# Patient Record
Sex: Female | Born: 1945
Health system: Southern US, Community
[De-identification: ages and names within clinical notes are randomized; demographics above are authoritative.]

## PROBLEM LIST (undated history)

## (undated) DIAGNOSIS — R011 Cardiac murmur, unspecified: Secondary | ICD-10-CM

## (undated) DIAGNOSIS — G8929 Other chronic pain: Secondary | ICD-10-CM

## (undated) DIAGNOSIS — M5416 Radiculopathy, lumbar region: Secondary | ICD-10-CM

## (undated) DIAGNOSIS — M1990 Unspecified osteoarthritis, unspecified site: Secondary | ICD-10-CM

## (undated) DIAGNOSIS — K219 Gastro-esophageal reflux disease without esophagitis: Secondary | ICD-10-CM

## (undated) DIAGNOSIS — G4730 Sleep apnea, unspecified: Secondary | ICD-10-CM

## (undated) DIAGNOSIS — I10 Essential (primary) hypertension: Secondary | ICD-10-CM

## (undated) DIAGNOSIS — R51 Headache: Secondary | ICD-10-CM

## (undated) HISTORY — PX: COLONOSCOPY: SHX5424

## (undated) HISTORY — PX: WISDOM TOOTH EXTRACTION: SHX21

---
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
## 1975-11-04 HISTORY — PX: ABDOMINAL HYSTERECTOMY: SHX81

## 1975-11-04 HISTORY — PX: APPENDECTOMY: SHX54

## 1988-07-30 HISTORY — PX: NASAL SINUS SURGERY: SHX719

## 1997-12-20 ENCOUNTER — Other Ambulatory Visit: Admission: RE | Admit: 1997-12-20 | Discharge: 1997-12-20 | Payer: Self-pay | Admitting: Gynecology

## 1999-02-24 ENCOUNTER — Other Ambulatory Visit: Admission: RE | Admit: 1999-02-24 | Discharge: 1999-02-24 | Payer: Self-pay | Admitting: Gynecology

## 1999-08-08 ENCOUNTER — Encounter: Admission: RE | Admit: 1999-08-08 | Discharge: 1999-08-08 | Payer: Self-pay | Admitting: *Deleted

## 1999-08-16 ENCOUNTER — Encounter: Payer: Self-pay | Admitting: Gynecology

## 1999-08-16 ENCOUNTER — Encounter: Admission: RE | Admit: 1999-08-16 | Discharge: 1999-08-16 | Payer: Self-pay | Admitting: Gynecology

## 1999-08-23 ENCOUNTER — Encounter: Admission: RE | Admit: 1999-08-23 | Discharge: 1999-08-23 | Payer: Self-pay | Admitting: Gynecology

## 1999-08-23 ENCOUNTER — Encounter: Payer: Self-pay | Admitting: Gynecology

## 1999-09-29 ENCOUNTER — Encounter: Admission: RE | Admit: 1999-09-29 | Discharge: 1999-09-29 | Payer: Self-pay | Admitting: *Deleted

## 2000-01-20 ENCOUNTER — Emergency Department (HOSPITAL_COMMUNITY): Admission: EM | Admit: 2000-01-20 | Discharge: 2000-01-20 | Payer: Self-pay | Admitting: Emergency Medicine

## 2000-02-05 ENCOUNTER — Encounter: Admission: RE | Admit: 2000-02-05 | Discharge: 2000-02-05 | Payer: Self-pay | Admitting: Endocrinology

## 2000-02-05 ENCOUNTER — Encounter: Payer: Self-pay | Admitting: Endocrinology

## 2000-03-04 ENCOUNTER — Encounter: Admission: RE | Admit: 2000-03-04 | Discharge: 2000-03-04 | Payer: Self-pay | Admitting: Gynecology

## 2000-03-04 ENCOUNTER — Encounter: Payer: Self-pay | Admitting: Gynecology

## 2000-05-17 ENCOUNTER — Other Ambulatory Visit: Admission: RE | Admit: 2000-05-17 | Discharge: 2000-05-17 | Payer: Self-pay | Admitting: Gynecology

## 2000-05-23 ENCOUNTER — Encounter: Admission: RE | Admit: 2000-05-23 | Discharge: 2000-05-23 | Payer: Self-pay | Admitting: Endocrinology

## 2000-05-23 ENCOUNTER — Encounter: Payer: Self-pay | Admitting: Internal Medicine

## 2000-09-04 ENCOUNTER — Encounter: Admission: RE | Admit: 2000-09-04 | Discharge: 2000-09-04 | Payer: Self-pay | Admitting: Gynecology

## 2000-09-04 ENCOUNTER — Encounter: Payer: Self-pay | Admitting: Gynecology

## 2001-06-17 ENCOUNTER — Encounter: Payer: Self-pay | Admitting: Internal Medicine

## 2001-06-17 ENCOUNTER — Encounter: Admission: RE | Admit: 2001-06-17 | Discharge: 2001-06-17 | Payer: Self-pay | Admitting: Internal Medicine

## 2001-07-28 ENCOUNTER — Other Ambulatory Visit: Admission: RE | Admit: 2001-07-28 | Discharge: 2001-07-28 | Payer: Self-pay | Admitting: Gynecology

## 2001-11-20 ENCOUNTER — Encounter: Payer: Self-pay | Admitting: Gynecology

## 2001-11-20 ENCOUNTER — Encounter: Admission: RE | Admit: 2001-11-20 | Discharge: 2001-11-20 | Payer: Self-pay | Admitting: Gynecology

## 2003-10-27 ENCOUNTER — Ambulatory Visit (HOSPITAL_COMMUNITY): Admission: RE | Admit: 2003-10-27 | Discharge: 2003-10-27 | Payer: Self-pay | Admitting: Gynecology

## 2005-06-07 ENCOUNTER — Other Ambulatory Visit: Admission: RE | Admit: 2005-06-07 | Discharge: 2005-06-07 | Payer: Self-pay | Admitting: Cardiology

## 2005-12-25 ENCOUNTER — Ambulatory Visit (HOSPITAL_COMMUNITY): Admission: RE | Admit: 2005-12-25 | Discharge: 2005-12-25 | Payer: Self-pay | Admitting: Endocrinology

## 2007-04-07 ENCOUNTER — Ambulatory Visit (HOSPITAL_COMMUNITY): Admission: RE | Admit: 2007-04-07 | Discharge: 2007-04-07 | Payer: Self-pay | Admitting: Endocrinology

## 2008-08-23 ENCOUNTER — Ambulatory Visit (HOSPITAL_COMMUNITY): Admission: RE | Admit: 2008-08-23 | Discharge: 2008-08-23 | Payer: Self-pay | Admitting: Endocrinology

## 2010-03-23 ENCOUNTER — Ambulatory Visit (HOSPITAL_COMMUNITY): Admission: RE | Admit: 2010-03-23 | Discharge: 2010-03-23 | Payer: Self-pay | Admitting: Internal Medicine

## 2010-08-01 ENCOUNTER — Encounter
Admission: RE | Admit: 2010-08-01 | Discharge: 2010-08-01 | Payer: Self-pay | Source: Home / Self Care | Attending: Internal Medicine | Admitting: Internal Medicine

## 2010-08-20 ENCOUNTER — Encounter: Payer: Self-pay | Admitting: Endocrinology

## 2010-08-20 ENCOUNTER — Encounter: Payer: Self-pay | Admitting: Internal Medicine

## 2011-08-09 DIAGNOSIS — J111 Influenza due to unidentified influenza virus with other respiratory manifestations: Secondary | ICD-10-CM | POA: Diagnosis not present

## 2011-08-15 DIAGNOSIS — G43009 Migraine without aura, not intractable, without status migrainosus: Secondary | ICD-10-CM | POA: Diagnosis not present

## 2011-08-15 DIAGNOSIS — M545 Low back pain: Secondary | ICD-10-CM | POA: Diagnosis not present

## 2011-08-15 DIAGNOSIS — M5126 Other intervertebral disc displacement, lumbar region: Secondary | ICD-10-CM | POA: Diagnosis not present

## 2011-08-15 DIAGNOSIS — M79609 Pain in unspecified limb: Secondary | ICD-10-CM | POA: Diagnosis not present

## 2011-08-28 DIAGNOSIS — M545 Low back pain: Secondary | ICD-10-CM | POA: Diagnosis not present

## 2011-08-28 DIAGNOSIS — M5430 Sciatica, unspecified side: Secondary | ICD-10-CM | POA: Diagnosis not present

## 2011-08-28 DIAGNOSIS — M999 Biomechanical lesion, unspecified: Secondary | ICD-10-CM | POA: Diagnosis not present

## 2011-08-28 DIAGNOSIS — M47817 Spondylosis without myelopathy or radiculopathy, lumbosacral region: Secondary | ICD-10-CM | POA: Diagnosis not present

## 2011-08-28 DIAGNOSIS — M5137 Other intervertebral disc degeneration, lumbosacral region: Secondary | ICD-10-CM | POA: Diagnosis not present

## 2011-08-28 DIAGNOSIS — M5126 Other intervertebral disc displacement, lumbar region: Secondary | ICD-10-CM | POA: Diagnosis not present

## 2011-08-31 DIAGNOSIS — M545 Low back pain: Secondary | ICD-10-CM | POA: Diagnosis not present

## 2011-08-31 DIAGNOSIS — M5137 Other intervertebral disc degeneration, lumbosacral region: Secondary | ICD-10-CM | POA: Diagnosis not present

## 2011-08-31 DIAGNOSIS — M999 Biomechanical lesion, unspecified: Secondary | ICD-10-CM | POA: Diagnosis not present

## 2011-08-31 DIAGNOSIS — M5126 Other intervertebral disc displacement, lumbar region: Secondary | ICD-10-CM | POA: Diagnosis not present

## 2011-08-31 DIAGNOSIS — M47817 Spondylosis without myelopathy or radiculopathy, lumbosacral region: Secondary | ICD-10-CM | POA: Diagnosis not present

## 2011-08-31 DIAGNOSIS — M5430 Sciatica, unspecified side: Secondary | ICD-10-CM | POA: Diagnosis not present

## 2011-09-03 DIAGNOSIS — M5126 Other intervertebral disc displacement, lumbar region: Secondary | ICD-10-CM | POA: Diagnosis not present

## 2011-09-03 DIAGNOSIS — M5430 Sciatica, unspecified side: Secondary | ICD-10-CM | POA: Diagnosis not present

## 2011-09-03 DIAGNOSIS — M47817 Spondylosis without myelopathy or radiculopathy, lumbosacral region: Secondary | ICD-10-CM | POA: Diagnosis not present

## 2011-09-03 DIAGNOSIS — M999 Biomechanical lesion, unspecified: Secondary | ICD-10-CM | POA: Diagnosis not present

## 2011-09-03 DIAGNOSIS — M545 Low back pain: Secondary | ICD-10-CM | POA: Diagnosis not present

## 2011-09-03 DIAGNOSIS — M5137 Other intervertebral disc degeneration, lumbosacral region: Secondary | ICD-10-CM | POA: Diagnosis not present

## 2011-09-05 DIAGNOSIS — M5126 Other intervertebral disc displacement, lumbar region: Secondary | ICD-10-CM | POA: Diagnosis not present

## 2011-09-05 DIAGNOSIS — G43009 Migraine without aura, not intractable, without status migrainosus: Secondary | ICD-10-CM | POA: Diagnosis not present

## 2011-09-05 DIAGNOSIS — M79609 Pain in unspecified limb: Secondary | ICD-10-CM | POA: Diagnosis not present

## 2011-09-05 DIAGNOSIS — M545 Low back pain: Secondary | ICD-10-CM | POA: Diagnosis not present

## 2011-09-07 DIAGNOSIS — M5126 Other intervertebral disc displacement, lumbar region: Secondary | ICD-10-CM | POA: Diagnosis not present

## 2011-09-07 DIAGNOSIS — M5137 Other intervertebral disc degeneration, lumbosacral region: Secondary | ICD-10-CM | POA: Diagnosis not present

## 2011-09-07 DIAGNOSIS — I10 Essential (primary) hypertension: Secondary | ICD-10-CM | POA: Diagnosis not present

## 2011-09-07 DIAGNOSIS — R0989 Other specified symptoms and signs involving the circulatory and respiratory systems: Secondary | ICD-10-CM | POA: Diagnosis not present

## 2011-09-07 DIAGNOSIS — M5430 Sciatica, unspecified side: Secondary | ICD-10-CM | POA: Diagnosis not present

## 2011-09-07 DIAGNOSIS — G4733 Obstructive sleep apnea (adult) (pediatric): Secondary | ICD-10-CM | POA: Diagnosis not present

## 2011-09-07 DIAGNOSIS — G4700 Insomnia, unspecified: Secondary | ICD-10-CM | POA: Diagnosis not present

## 2011-09-07 DIAGNOSIS — G4710 Hypersomnia, unspecified: Secondary | ICD-10-CM | POA: Diagnosis not present

## 2011-09-07 DIAGNOSIS — M999 Biomechanical lesion, unspecified: Secondary | ICD-10-CM | POA: Diagnosis not present

## 2011-09-07 DIAGNOSIS — M545 Low back pain: Secondary | ICD-10-CM | POA: Diagnosis not present

## 2011-09-07 DIAGNOSIS — R5382 Chronic fatigue, unspecified: Secondary | ICD-10-CM | POA: Diagnosis not present

## 2011-09-07 DIAGNOSIS — M47817 Spondylosis without myelopathy or radiculopathy, lumbosacral region: Secondary | ICD-10-CM | POA: Diagnosis not present

## 2011-09-10 DIAGNOSIS — M5430 Sciatica, unspecified side: Secondary | ICD-10-CM | POA: Diagnosis not present

## 2011-09-10 DIAGNOSIS — M5126 Other intervertebral disc displacement, lumbar region: Secondary | ICD-10-CM | POA: Diagnosis not present

## 2011-09-10 DIAGNOSIS — M47817 Spondylosis without myelopathy or radiculopathy, lumbosacral region: Secondary | ICD-10-CM | POA: Diagnosis not present

## 2011-09-10 DIAGNOSIS — M999 Biomechanical lesion, unspecified: Secondary | ICD-10-CM | POA: Diagnosis not present

## 2011-09-10 DIAGNOSIS — M5137 Other intervertebral disc degeneration, lumbosacral region: Secondary | ICD-10-CM | POA: Diagnosis not present

## 2011-09-10 DIAGNOSIS — M545 Low back pain: Secondary | ICD-10-CM | POA: Diagnosis not present

## 2011-09-16 DIAGNOSIS — G4733 Obstructive sleep apnea (adult) (pediatric): Secondary | ICD-10-CM | POA: Diagnosis not present

## 2011-09-17 DIAGNOSIS — M5137 Other intervertebral disc degeneration, lumbosacral region: Secondary | ICD-10-CM | POA: Diagnosis not present

## 2011-09-17 DIAGNOSIS — M5430 Sciatica, unspecified side: Secondary | ICD-10-CM | POA: Diagnosis not present

## 2011-09-17 DIAGNOSIS — M47817 Spondylosis without myelopathy or radiculopathy, lumbosacral region: Secondary | ICD-10-CM | POA: Diagnosis not present

## 2011-09-17 DIAGNOSIS — M999 Biomechanical lesion, unspecified: Secondary | ICD-10-CM | POA: Diagnosis not present

## 2011-09-17 DIAGNOSIS — M545 Low back pain: Secondary | ICD-10-CM | POA: Diagnosis not present

## 2011-09-17 DIAGNOSIS — M5126 Other intervertebral disc displacement, lumbar region: Secondary | ICD-10-CM | POA: Diagnosis not present

## 2011-10-08 DIAGNOSIS — M5126 Other intervertebral disc displacement, lumbar region: Secondary | ICD-10-CM | POA: Diagnosis not present

## 2011-10-15 DIAGNOSIS — M5126 Other intervertebral disc displacement, lumbar region: Secondary | ICD-10-CM | POA: Diagnosis not present

## 2011-10-18 DIAGNOSIS — M5126 Other intervertebral disc displacement, lumbar region: Secondary | ICD-10-CM | POA: Diagnosis not present

## 2011-10-24 DIAGNOSIS — M5126 Other intervertebral disc displacement, lumbar region: Secondary | ICD-10-CM | POA: Diagnosis not present

## 2011-10-31 DIAGNOSIS — M5137 Other intervertebral disc degeneration, lumbosacral region: Secondary | ICD-10-CM | POA: Diagnosis not present

## 2011-10-31 DIAGNOSIS — M545 Low back pain: Secondary | ICD-10-CM | POA: Diagnosis not present

## 2011-11-06 DIAGNOSIS — R937 Abnormal findings on diagnostic imaging of other parts of musculoskeletal system: Secondary | ICD-10-CM | POA: Diagnosis not present

## 2011-11-06 DIAGNOSIS — M545 Low back pain: Secondary | ICD-10-CM | POA: Diagnosis not present

## 2011-11-13 DIAGNOSIS — H43819 Vitreous degeneration, unspecified eye: Secondary | ICD-10-CM | POA: Diagnosis not present

## 2011-11-13 DIAGNOSIS — H401290 Low-tension glaucoma, unspecified eye, stage unspecified: Secondary | ICD-10-CM | POA: Diagnosis not present

## 2011-12-11 DIAGNOSIS — M79609 Pain in unspecified limb: Secondary | ICD-10-CM | POA: Diagnosis not present

## 2011-12-11 DIAGNOSIS — M5126 Other intervertebral disc displacement, lumbar region: Secondary | ICD-10-CM | POA: Diagnosis not present

## 2011-12-11 DIAGNOSIS — M545 Low back pain: Secondary | ICD-10-CM | POA: Diagnosis not present

## 2011-12-11 DIAGNOSIS — G43009 Migraine without aura, not intractable, without status migrainosus: Secondary | ICD-10-CM | POA: Diagnosis not present

## 2012-01-01 DIAGNOSIS — G4733 Obstructive sleep apnea (adult) (pediatric): Secondary | ICD-10-CM | POA: Diagnosis not present

## 2012-01-03 DIAGNOSIS — D485 Neoplasm of uncertain behavior of skin: Secondary | ICD-10-CM | POA: Diagnosis not present

## 2012-01-03 DIAGNOSIS — L570 Actinic keratosis: Secondary | ICD-10-CM | POA: Diagnosis not present

## 2012-01-03 DIAGNOSIS — L918 Other hypertrophic disorders of the skin: Secondary | ICD-10-CM | POA: Diagnosis not present

## 2012-01-30 DIAGNOSIS — R51 Headache: Secondary | ICD-10-CM | POA: Diagnosis not present

## 2012-02-07 DIAGNOSIS — E663 Overweight: Secondary | ICD-10-CM | POA: Diagnosis not present

## 2012-02-07 DIAGNOSIS — G43909 Migraine, unspecified, not intractable, without status migrainosus: Secondary | ICD-10-CM | POA: Diagnosis not present

## 2012-02-13 DIAGNOSIS — M79609 Pain in unspecified limb: Secondary | ICD-10-CM | POA: Diagnosis not present

## 2012-02-13 DIAGNOSIS — M5126 Other intervertebral disc displacement, lumbar region: Secondary | ICD-10-CM | POA: Diagnosis not present

## 2012-02-13 DIAGNOSIS — M545 Low back pain: Secondary | ICD-10-CM | POA: Diagnosis not present

## 2012-02-13 DIAGNOSIS — G43009 Migraine without aura, not intractable, without status migrainosus: Secondary | ICD-10-CM | POA: Diagnosis not present

## 2012-02-15 DIAGNOSIS — M545 Low back pain: Secondary | ICD-10-CM | POA: Diagnosis not present

## 2012-02-15 DIAGNOSIS — M5137 Other intervertebral disc degeneration, lumbosacral region: Secondary | ICD-10-CM | POA: Diagnosis not present

## 2012-02-15 DIAGNOSIS — R937 Abnormal findings on diagnostic imaging of other parts of musculoskeletal system: Secondary | ICD-10-CM | POA: Diagnosis not present

## 2012-02-15 DIAGNOSIS — M47817 Spondylosis without myelopathy or radiculopathy, lumbosacral region: Secondary | ICD-10-CM | POA: Diagnosis not present

## 2012-03-25 DIAGNOSIS — M79609 Pain in unspecified limb: Secondary | ICD-10-CM | POA: Diagnosis not present

## 2012-03-25 DIAGNOSIS — G43009 Migraine without aura, not intractable, without status migrainosus: Secondary | ICD-10-CM | POA: Diagnosis not present

## 2012-03-25 DIAGNOSIS — M545 Low back pain: Secondary | ICD-10-CM | POA: Diagnosis not present

## 2012-03-25 DIAGNOSIS — M5126 Other intervertebral disc displacement, lumbar region: Secondary | ICD-10-CM | POA: Diagnosis not present

## 2012-04-14 DIAGNOSIS — H43819 Vitreous degeneration, unspecified eye: Secondary | ICD-10-CM | POA: Diagnosis not present

## 2012-04-14 DIAGNOSIS — H401290 Low-tension glaucoma, unspecified eye, stage unspecified: Secondary | ICD-10-CM | POA: Diagnosis not present

## 2012-04-20 DIAGNOSIS — Z23 Encounter for immunization: Secondary | ICD-10-CM | POA: Diagnosis not present

## 2012-05-30 DIAGNOSIS — M79609 Pain in unspecified limb: Secondary | ICD-10-CM | POA: Diagnosis not present

## 2012-05-30 DIAGNOSIS — M545 Low back pain: Secondary | ICD-10-CM | POA: Diagnosis not present

## 2012-05-30 DIAGNOSIS — M542 Cervicalgia: Secondary | ICD-10-CM | POA: Diagnosis not present

## 2012-05-30 DIAGNOSIS — M5126 Other intervertebral disc displacement, lumbar region: Secondary | ICD-10-CM | POA: Diagnosis not present

## 2012-05-30 DIAGNOSIS — G2581 Restless legs syndrome: Secondary | ICD-10-CM | POA: Diagnosis not present

## 2012-05-30 DIAGNOSIS — G43009 Migraine without aura, not intractable, without status migrainosus: Secondary | ICD-10-CM | POA: Diagnosis not present

## 2012-09-18 DIAGNOSIS — E559 Vitamin D deficiency, unspecified: Secondary | ICD-10-CM | POA: Diagnosis not present

## 2012-09-18 DIAGNOSIS — I10 Essential (primary) hypertension: Secondary | ICD-10-CM | POA: Diagnosis not present

## 2012-09-18 DIAGNOSIS — E7800 Pure hypercholesterolemia, unspecified: Secondary | ICD-10-CM | POA: Diagnosis not present

## 2012-09-23 DIAGNOSIS — Z0000 Encounter for general adult medical examination without abnormal findings: Secondary | ICD-10-CM | POA: Diagnosis not present

## 2012-09-24 DIAGNOSIS — I10 Essential (primary) hypertension: Secondary | ICD-10-CM | POA: Diagnosis not present

## 2012-09-24 DIAGNOSIS — R252 Cramp and spasm: Secondary | ICD-10-CM | POA: Diagnosis not present

## 2012-09-24 DIAGNOSIS — M549 Dorsalgia, unspecified: Secondary | ICD-10-CM | POA: Diagnosis not present

## 2012-09-24 DIAGNOSIS — H409 Unspecified glaucoma: Secondary | ICD-10-CM | POA: Diagnosis not present

## 2012-10-01 DIAGNOSIS — G43009 Migraine without aura, not intractable, without status migrainosus: Secondary | ICD-10-CM | POA: Diagnosis not present

## 2012-10-01 DIAGNOSIS — M545 Low back pain: Secondary | ICD-10-CM | POA: Diagnosis not present

## 2012-10-01 DIAGNOSIS — G2581 Restless legs syndrome: Secondary | ICD-10-CM | POA: Diagnosis not present

## 2012-10-01 DIAGNOSIS — M542 Cervicalgia: Secondary | ICD-10-CM | POA: Diagnosis not present

## 2012-10-01 DIAGNOSIS — M5126 Other intervertebral disc displacement, lumbar region: Secondary | ICD-10-CM | POA: Diagnosis not present

## 2012-10-01 DIAGNOSIS — M79609 Pain in unspecified limb: Secondary | ICD-10-CM | POA: Diagnosis not present

## 2012-10-16 DIAGNOSIS — Z79899 Other long term (current) drug therapy: Secondary | ICD-10-CM | POA: Diagnosis not present

## 2012-10-16 DIAGNOSIS — M1990 Unspecified osteoarthritis, unspecified site: Secondary | ICD-10-CM | POA: Diagnosis not present

## 2012-10-16 DIAGNOSIS — G894 Chronic pain syndrome: Secondary | ICD-10-CM | POA: Diagnosis not present

## 2012-10-16 DIAGNOSIS — M5137 Other intervertebral disc degeneration, lumbosacral region: Secondary | ICD-10-CM | POA: Diagnosis not present

## 2012-10-23 DIAGNOSIS — H401290 Low-tension glaucoma, unspecified eye, stage unspecified: Secondary | ICD-10-CM | POA: Diagnosis not present

## 2012-10-23 DIAGNOSIS — H43819 Vitreous degeneration, unspecified eye: Secondary | ICD-10-CM | POA: Diagnosis not present

## 2012-11-13 DIAGNOSIS — Z79899 Other long term (current) drug therapy: Secondary | ICD-10-CM | POA: Diagnosis not present

## 2012-11-13 DIAGNOSIS — M79609 Pain in unspecified limb: Secondary | ICD-10-CM | POA: Diagnosis not present

## 2012-11-13 DIAGNOSIS — M5137 Other intervertebral disc degeneration, lumbosacral region: Secondary | ICD-10-CM | POA: Diagnosis not present

## 2012-11-13 DIAGNOSIS — G894 Chronic pain syndrome: Secondary | ICD-10-CM | POA: Diagnosis not present

## 2013-01-15 DIAGNOSIS — Z79899 Other long term (current) drug therapy: Secondary | ICD-10-CM | POA: Diagnosis not present

## 2013-01-15 DIAGNOSIS — G894 Chronic pain syndrome: Secondary | ICD-10-CM | POA: Diagnosis not present

## 2013-01-15 DIAGNOSIS — M5137 Other intervertebral disc degeneration, lumbosacral region: Secondary | ICD-10-CM | POA: Diagnosis not present

## 2013-01-15 DIAGNOSIS — M1990 Unspecified osteoarthritis, unspecified site: Secondary | ICD-10-CM | POA: Diagnosis not present

## 2013-02-12 DIAGNOSIS — IMO0001 Reserved for inherently not codable concepts without codable children: Secondary | ICD-10-CM | POA: Diagnosis not present

## 2013-02-12 DIAGNOSIS — M1990 Unspecified osteoarthritis, unspecified site: Secondary | ICD-10-CM | POA: Diagnosis not present

## 2013-02-12 DIAGNOSIS — M5137 Other intervertebral disc degeneration, lumbosacral region: Secondary | ICD-10-CM | POA: Diagnosis not present

## 2013-02-12 DIAGNOSIS — G894 Chronic pain syndrome: Secondary | ICD-10-CM | POA: Diagnosis not present

## 2013-02-24 DIAGNOSIS — E663 Overweight: Secondary | ICD-10-CM | POA: Diagnosis not present

## 2013-02-24 DIAGNOSIS — I10 Essential (primary) hypertension: Secondary | ICD-10-CM | POA: Diagnosis not present

## 2013-02-24 DIAGNOSIS — E7800 Pure hypercholesterolemia, unspecified: Secondary | ICD-10-CM | POA: Diagnosis not present

## 2013-02-24 DIAGNOSIS — M545 Low back pain: Secondary | ICD-10-CM | POA: Diagnosis not present

## 2013-02-24 DIAGNOSIS — F411 Generalized anxiety disorder: Secondary | ICD-10-CM | POA: Diagnosis not present

## 2013-02-24 DIAGNOSIS — Z006 Encounter for examination for normal comparison and control in clinical research program: Secondary | ICD-10-CM | POA: Diagnosis not present

## 2013-03-04 DIAGNOSIS — M810 Age-related osteoporosis without current pathological fracture: Secondary | ICD-10-CM | POA: Diagnosis not present

## 2013-03-05 ENCOUNTER — Telehealth: Payer: Self-pay | Admitting: *Deleted

## 2013-03-05 NOTE — Telephone Encounter (Signed)
Faxed supporting documentation  letter back to [facility] informing them that Dr. Tresa Endo was just the reading MD. He has never seen this patient before. They will need to get the documentation from the referring MD.

## 2013-04-02 DIAGNOSIS — G894 Chronic pain syndrome: Secondary | ICD-10-CM | POA: Diagnosis not present

## 2013-04-02 DIAGNOSIS — Z79899 Other long term (current) drug therapy: Secondary | ICD-10-CM | POA: Diagnosis not present

## 2013-04-02 DIAGNOSIS — M47817 Spondylosis without myelopathy or radiculopathy, lumbosacral region: Secondary | ICD-10-CM | POA: Diagnosis not present

## 2013-04-02 DIAGNOSIS — IMO0001 Reserved for inherently not codable concepts without codable children: Secondary | ICD-10-CM | POA: Diagnosis not present

## 2013-04-02 DIAGNOSIS — M1990 Unspecified osteoarthritis, unspecified site: Secondary | ICD-10-CM | POA: Diagnosis not present

## 2013-04-17 DIAGNOSIS — IMO0002 Reserved for concepts with insufficient information to code with codable children: Secondary | ICD-10-CM | POA: Diagnosis not present

## 2013-04-17 DIAGNOSIS — M546 Pain in thoracic spine: Secondary | ICD-10-CM | POA: Diagnosis not present

## 2013-04-17 DIAGNOSIS — M25579 Pain in unspecified ankle and joints of unspecified foot: Secondary | ICD-10-CM | POA: Diagnosis not present

## 2013-04-30 DIAGNOSIS — M47817 Spondylosis without myelopathy or radiculopathy, lumbosacral region: Secondary | ICD-10-CM | POA: Diagnosis not present

## 2013-04-30 DIAGNOSIS — IMO0001 Reserved for inherently not codable concepts without codable children: Secondary | ICD-10-CM | POA: Diagnosis not present

## 2013-04-30 DIAGNOSIS — Z79899 Other long term (current) drug therapy: Secondary | ICD-10-CM | POA: Diagnosis not present

## 2013-04-30 DIAGNOSIS — M1990 Unspecified osteoarthritis, unspecified site: Secondary | ICD-10-CM | POA: Diagnosis not present

## 2013-04-30 DIAGNOSIS — G894 Chronic pain syndrome: Secondary | ICD-10-CM | POA: Diagnosis not present

## 2013-05-06 DIAGNOSIS — M5137 Other intervertebral disc degeneration, lumbosacral region: Secondary | ICD-10-CM | POA: Diagnosis not present

## 2013-05-06 DIAGNOSIS — M542 Cervicalgia: Secondary | ICD-10-CM | POA: Diagnosis not present

## 2013-05-06 DIAGNOSIS — M48061 Spinal stenosis, lumbar region without neurogenic claudication: Secondary | ICD-10-CM | POA: Diagnosis not present

## 2013-05-06 DIAGNOSIS — M4310 Spondylolisthesis, site unspecified: Secondary | ICD-10-CM | POA: Diagnosis not present

## 2013-05-15 DIAGNOSIS — H2510 Age-related nuclear cataract, unspecified eye: Secondary | ICD-10-CM | POA: Diagnosis not present

## 2013-05-15 DIAGNOSIS — H401290 Low-tension glaucoma, unspecified eye, stage unspecified: Secondary | ICD-10-CM | POA: Diagnosis not present

## 2013-05-15 DIAGNOSIS — H43819 Vitreous degeneration, unspecified eye: Secondary | ICD-10-CM | POA: Diagnosis not present

## 2013-05-26 DIAGNOSIS — Z79899 Other long term (current) drug therapy: Secondary | ICD-10-CM | POA: Diagnosis not present

## 2013-05-26 DIAGNOSIS — G894 Chronic pain syndrome: Secondary | ICD-10-CM | POA: Diagnosis not present

## 2013-05-26 DIAGNOSIS — IMO0001 Reserved for inherently not codable concepts without codable children: Secondary | ICD-10-CM | POA: Diagnosis not present

## 2013-05-26 DIAGNOSIS — M5137 Other intervertebral disc degeneration, lumbosacral region: Secondary | ICD-10-CM | POA: Diagnosis not present

## 2013-05-26 DIAGNOSIS — M47817 Spondylosis without myelopathy or radiculopathy, lumbosacral region: Secondary | ICD-10-CM | POA: Diagnosis not present

## 2013-05-28 DIAGNOSIS — M25579 Pain in unspecified ankle and joints of unspecified foot: Secondary | ICD-10-CM | POA: Diagnosis not present

## 2013-05-28 DIAGNOSIS — M545 Low back pain: Secondary | ICD-10-CM | POA: Diagnosis not present

## 2013-05-28 DIAGNOSIS — M4310 Spondylolisthesis, site unspecified: Secondary | ICD-10-CM | POA: Diagnosis not present

## 2013-05-28 DIAGNOSIS — F341 Dysthymic disorder: Secondary | ICD-10-CM | POA: Diagnosis not present

## 2013-05-28 DIAGNOSIS — M48061 Spinal stenosis, lumbar region without neurogenic claudication: Secondary | ICD-10-CM | POA: Diagnosis not present

## 2013-06-02 ENCOUNTER — Emergency Department (HOSPITAL_COMMUNITY): Payer: No Typology Code available for payment source

## 2013-06-02 ENCOUNTER — Encounter (HOSPITAL_COMMUNITY): Payer: Self-pay | Admitting: Emergency Medicine

## 2013-06-02 ENCOUNTER — Inpatient Hospital Stay (HOSPITAL_COMMUNITY)
Admission: EM | Admit: 2013-06-02 | Discharge: 2013-06-03 | DRG: 087 | Disposition: A | Discharge status: Home / Self Care | Payer: No Typology Code available for payment source | Attending: Internal Medicine | Admitting: Internal Medicine

## 2013-06-02 DIAGNOSIS — S022XXA Fracture of nasal bones, initial encounter for closed fracture: Secondary | ICD-10-CM | POA: Diagnosis not present

## 2013-06-02 DIAGNOSIS — M2550 Pain in unspecified joint: Secondary | ICD-10-CM

## 2013-06-02 DIAGNOSIS — R220 Localized swelling, mass and lump, head: Secondary | ICD-10-CM | POA: Diagnosis not present

## 2013-06-02 DIAGNOSIS — M129 Arthropathy, unspecified: Secondary | ICD-10-CM | POA: Diagnosis present

## 2013-06-02 DIAGNOSIS — Z5189 Encounter for other specified aftercare: Secondary | ICD-10-CM | POA: Diagnosis not present

## 2013-06-02 DIAGNOSIS — S0993XA Unspecified injury of face, initial encounter: Secondary | ICD-10-CM | POA: Diagnosis not present

## 2013-06-02 DIAGNOSIS — S065XAA Traumatic subdural hemorrhage with loss of consciousness status unknown, initial encounter: Secondary | ICD-10-CM | POA: Diagnosis not present

## 2013-06-02 DIAGNOSIS — R55 Syncope and collapse: Secondary | ICD-10-CM | POA: Diagnosis present

## 2013-06-02 DIAGNOSIS — M79609 Pain in unspecified limb: Secondary | ICD-10-CM | POA: Diagnosis present

## 2013-06-02 DIAGNOSIS — S065X9A Traumatic subdural hemorrhage with loss of consciousness of unspecified duration, initial encounter: Secondary | ICD-10-CM | POA: Diagnosis present

## 2013-06-02 DIAGNOSIS — I6200 Nontraumatic subdural hemorrhage, unspecified: Secondary | ICD-10-CM

## 2013-06-02 DIAGNOSIS — S0990XA Unspecified injury of head, initial encounter: Secondary | ICD-10-CM | POA: Diagnosis not present

## 2013-06-02 DIAGNOSIS — S0992XA Unspecified injury of nose, initial encounter: Secondary | ICD-10-CM

## 2013-06-02 DIAGNOSIS — S298XXA Other specified injuries of thorax, initial encounter: Secondary | ICD-10-CM | POA: Diagnosis not present

## 2013-06-02 DIAGNOSIS — M47812 Spondylosis without myelopathy or radiculopathy, cervical region: Secondary | ICD-10-CM | POA: Diagnosis present

## 2013-06-02 DIAGNOSIS — R413 Other amnesia: Secondary | ICD-10-CM | POA: Diagnosis present

## 2013-06-02 DIAGNOSIS — I369 Nonrheumatic tricuspid valve disorder, unspecified: Secondary | ICD-10-CM | POA: Diagnosis not present

## 2013-06-02 DIAGNOSIS — T148XXA Other injury of unspecified body region, initial encounter: Secondary | ICD-10-CM | POA: Diagnosis not present

## 2013-06-02 DIAGNOSIS — S0992XD Unspecified injury of nose, subsequent encounter: Secondary | ICD-10-CM

## 2013-06-02 DIAGNOSIS — R404 Transient alteration of awareness: Secondary | ICD-10-CM | POA: Diagnosis not present

## 2013-06-02 HISTORY — DX: Unspecified osteoarthritis, unspecified site: M19.90

## 2013-06-02 HISTORY — DX: Headache: R51

## 2013-06-02 HISTORY — DX: Radiculopathy, lumbar region: M54.16

## 2013-06-02 HISTORY — DX: Other chronic pain: G89.29

## 2013-06-02 LAB — URINALYSIS, ROUTINE W REFLEX MICROSCOPIC
Glucose, UA: NEGATIVE mg/dL
Hgb urine dipstick: NEGATIVE
Protein, ur: NEGATIVE mg/dL
Specific Gravity, Urine: 1.009 (ref 1.005–1.030)

## 2013-06-02 LAB — CBC
Hemoglobin: 11.6 g/dL — ABNORMAL LOW (ref 12.0–15.0)
MCH: 28.9 pg (ref 26.0–34.0)
RBC: 4.01 MIL/uL (ref 3.87–5.11)

## 2013-06-02 LAB — BASIC METABOLIC PANEL
CO2: 28 mEq/L (ref 19–32)
Calcium: 9.3 mg/dL (ref 8.4–10.5)
Glucose, Bld: 111 mg/dL — ABNORMAL HIGH (ref 70–99)
Sodium: 139 mEq/L (ref 135–145)

## 2013-06-02 LAB — TROPONIN I: Troponin I: <0.3 ng/mL (ref <0.30)

## 2013-06-02 MED ORDER — ONDANSETRON HCL 4 MG PO TABS: 4.0000 mg | ORAL_TABLET | Freq: Four times a day (QID) | ORAL | Status: DC | PRN

## 2013-06-02 MED ORDER — OXYCODONE-ACETAMINOPHEN 5-325 MG PO TABS
1.0000 | ORAL_TABLET | Freq: Four times a day (QID) | ORAL | Status: DC | PRN
  Administered 2013-06-03: 1 via ORAL
  Filled 2013-06-02 (×2): qty 1

## 2013-06-02 MED ORDER — MORPHINE SULFATE 4 MG/ML IJ SOLN
4.0000 mg | Freq: Once | INTRAMUSCULAR | Status: AC
  Administered 2013-06-02: 4 mg via INTRAVENOUS
  Filled 2013-06-02: qty 1

## 2013-06-02 MED ORDER — ONDANSETRON HCL 4 MG/2ML IJ SOLN: 4.0000 mg | Freq: Four times a day (QID) | INTRAMUSCULAR | Status: DC | PRN

## 2013-06-02 MED ORDER — OXYCODONE-ACETAMINOPHEN 10-650 MG PO TABS: 1.0000 | ORAL_TABLET | Freq: Four times a day (QID) | ORAL | Status: DC | PRN

## 2013-06-02 MED ORDER — SODIUM CHLORIDE 0.9 % IJ SOLN: 3.0000 mL | Freq: Two times a day (BID) | INTRAMUSCULAR | Status: DC

## 2013-06-02 MED ORDER — FERROUS SULFATE 325 (65 FE) MG PO TABS
325.0000 mg | ORAL_TABLET | Freq: Two times a day (BID) | ORAL | Status: DC
  Administered 2013-06-03: 325 mg via ORAL
  Filled 2013-06-02 (×3): qty 1

## 2013-06-02 MED ORDER — BUPROPION HCL 75 MG PO TABS
75.0000 mg | ORAL_TABLET | Freq: Every day | ORAL | Status: DC
  Administered 2013-06-02 – 2013-06-03 (×2): 75 mg via ORAL
  Filled 2013-06-02 (×2): qty 1

## 2013-06-02 MED ORDER — DEXTROSE 5 % IV SOLN
1.0000 g | Freq: Once | INTRAVENOUS | Status: AC
  Administered 2013-06-02: 1 g via INTRAVENOUS
  Filled 2013-06-02: qty 10

## 2013-06-02 MED ORDER — SERTRALINE HCL 100 MG PO TABS
100.0000 mg | ORAL_TABLET | Freq: Every day | ORAL | Status: DC
  Administered 2013-06-02: 100 mg via ORAL
  Filled 2013-06-02 (×2): qty 1

## 2013-06-02 MED ORDER — OXYCODONE HCL 5 MG PO TABS: 5.0000 mg | ORAL_TABLET | Freq: Four times a day (QID) | ORAL | Status: DC | PRN

## 2013-06-02 MED ORDER — OXYCODONE HCL 5 MG PO TABS
5.0000 mg | ORAL_TABLET | ORAL | Status: DC | PRN
  Administered 2013-06-03 (×2): 5 mg via ORAL
  Filled 2013-06-02: qty 1

## 2013-06-02 MED ORDER — ETODOLAC 400 MG PO TABS
400.0000 mg | ORAL_TABLET | Freq: Two times a day (BID) | ORAL | Status: DC
  Administered 2013-06-02 – 2013-06-03 (×2): 400 mg via ORAL
  Filled 2013-06-02 (×3): qty 1
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
## 2013-06-02 NOTE — ED Notes (Signed)
Tray order placed for heart healthy diet.
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
## 2013-06-02 NOTE — ED Notes (Signed)
Patient at CT

## 2013-06-03 ENCOUNTER — Inpatient Hospital Stay (HOSPITAL_COMMUNITY): Payer: No Typology Code available for payment source

## 2013-06-03 DIAGNOSIS — M2550 Pain in unspecified joint: Secondary | ICD-10-CM | POA: Diagnosis present

## 2013-06-03 DIAGNOSIS — I369 Nonrheumatic tricuspid valve disorder, unspecified: Secondary | ICD-10-CM

## 2013-06-03 DIAGNOSIS — S022XXA Fracture of nasal bones, initial encounter for closed fracture: Secondary | ICD-10-CM

## 2013-06-03 DIAGNOSIS — R55 Syncope and collapse: Secondary | ICD-10-CM | POA: Diagnosis present

## 2013-06-03 DIAGNOSIS — R404 Transient alteration of awareness: Secondary | ICD-10-CM

## 2013-06-03 DIAGNOSIS — S0992XA Unspecified injury of nose, initial encounter: Secondary | ICD-10-CM

## 2013-06-03 DIAGNOSIS — S065X9A Traumatic subdural hemorrhage with loss of consciousness of unspecified duration, initial encounter: Secondary | ICD-10-CM | POA: Diagnosis present

## 2013-06-03 DIAGNOSIS — Z5189 Encounter for other specified aftercare: Secondary | ICD-10-CM

## 2013-06-03 LAB — COMPREHENSIVE METABOLIC PANEL
AST: 49 U/L — ABNORMAL HIGH (ref 0–37)
Albumin: 3.4 g/dL — ABNORMAL LOW (ref 3.5–5.2)
BUN: 25 mg/dL — ABNORMAL HIGH (ref 6–23)
Calcium: 9.8 mg/dL (ref 8.4–10.5)
Chloride: 108 mEq/L (ref 96–112)
Creatinine, Ser: 1.08 mg/dL (ref 0.50–1.10)
Glucose, Bld: 90 mg/dL (ref 70–99)
Total Bilirubin: 0.3 mg/dL (ref 0.3–1.2)
Total Protein: 6.2 g/dL (ref 6.0–8.3)

## 2013-06-03 LAB — CBC
HCT: 34.8 % — ABNORMAL LOW (ref 36.0–46.0)
Hemoglobin: 11.2 g/dL — ABNORMAL LOW (ref 12.0–15.0)
MCV: 88.5 fL (ref 78.0–100.0)
Platelets: 254 10*3/uL (ref 150–400)
RBC: 3.93 MIL/uL (ref 3.87–5.11)
WBC: 6.6 10*3/uL (ref 4.0–10.5)

## 2013-06-03 LAB — GLUCOSE, CAPILLARY: Glucose-Capillary: 124 mg/dL — ABNORMAL HIGH (ref 70–99)

## 2013-06-03 LAB — TSH: TSH: 1.114 u[IU]/mL (ref 0.350–4.500)

## 2013-06-03 NOTE — Progress Notes (Signed)
VASCULAR LAB PRELIMINARY  PRELIMINARY  PRELIMINARY  PRELIMINARY  Carotid duplex completed.    Preliminary report:  Bilateral : No evidence of ICA stenosis. Vertebral artery flow is antegrade.  Elsworth Ledin, RVS 06/03/2013, 3:00 PM

## 2013-06-03 NOTE — Progress Notes (Signed)
Echocardiogram 2D Echocardiogram has been performed.  Dorothey Baseman 06/03/2013, 11:47 AM
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
## 2013-06-03 NOTE — Progress Notes (Signed)
Discharge instructions reviewed with patient/family.  Follow up appointments reviewed.  Home medications reviewed as well as activity restrictions.  Both voice understanding to teaching. Copies of D/C instructions given to patient.  All Belongings returned to patient. To door via wheelchair. Home via POV with her daughter driving.
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
## 2013-06-03 NOTE — Progress Notes (Signed)
Patient is awake and alert.  Doing great.  Has not had repeat CT yet.  Daughter at bedside.    Will likely be able to go home later today or tomorrow.  Will get OMF followup.  This patient has been seen and I agree with the findings and treatment plan.  Marta Lamas. Gae Bon, MD, FACS [phone] (pager) [phone] (direct pager) Trauma Surgeon
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
## 2013-06-03 NOTE — Procedures (Signed)
EEG report.  Brief clinical history: 67 years old female who sustained an isolated episode of brief alteration of consciousness. Differential diagnosis is syncope versus seizures.  Technique: this is a 17 channel routine scalp EEG performed at the bedside with bipolar and monopolar montages arranged in accordance to the international 10/20 system of electrode placement. One channel was dedicated to EKG recording.  No sleep was achieved. No activating procedures performed.  Description:In the wakeful state, the best background consisted of a medium amplitude, posterior dominant, well sustained, symmetric and reactive 10 Hz rhythm. Frequent bursts of high amplitude, generalized theta slowing lasting up to 4-5 seconds and without a clear ictal pattern were seen. No focal or generalized epileptiform discharges noted.   EKG showed sinus rhythm.  Impression: this is an abnormal awake EEG with findings that could suggest global but non specific cerebral dysfunction. Please, be aware that the absence of epileptiform discharges does not exclude the possibility of epilepsy. If clinically indicated, a follow up EEG with sleep deprivation or an ambulatory 24 hour ambulatory EEG could provide further information regarding patient paroxysmal event.  Clinical correlation is advised.    Wyatt Portela, MD [facility]

## 2013-06-03 NOTE — Progress Notes (Signed)
EEG Completed; Results Pending  

## 2013-06-08 DIAGNOSIS — S022XXA Fracture of nasal bones, initial encounter for closed fracture: Secondary | ICD-10-CM | POA: Diagnosis not present

## 2013-06-08 DIAGNOSIS — S0180XA Unspecified open wound of other part of head, initial encounter: Secondary | ICD-10-CM | POA: Diagnosis not present

## 2013-06-09 ENCOUNTER — Encounter: Payer: Self-pay | Admitting: Neurology

## 2013-06-09 ENCOUNTER — Ambulatory Visit (INDEPENDENT_AMBULATORY_CARE_PROVIDER_SITE_OTHER): Payer: Medicare Other | Admitting: Neurology

## 2013-06-09 VITALS — BP 136/82 | HR 80 | Temp 98.0°F | Resp 14 | Ht 64.0 in | Wt 143.7 lb

## 2013-06-09 DIAGNOSIS — R55 Syncope and collapse: Secondary | ICD-10-CM | POA: Diagnosis not present
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
## 2013-06-09 NOTE — Patient Instructions (Addendum)
1.  No driving, no cooking alone, no working with heavy/dangerous equipment, no tub baths alone (door unlocked).  You may stay alone during the day. 2.  Have a great thanksgiving! 3.  MRI is scheduled for Thursday November 13th at 7:45am [facility] entrance A. [phone] 4.  EEG is scheduled for Tuesday November 25th at 9:00am [facility] entrance A. [phone]

## 2013-06-11 ENCOUNTER — Ambulatory Visit (HOSPITAL_COMMUNITY)
Admission: RE | Admit: 2013-06-11 | Discharge: 2013-06-11 | Disposition: A | Discharge status: Home / Self Care | Payer: Medicare Other | Source: Ambulatory Visit | Attending: Neurology | Admitting: Neurology

## 2013-06-11 DIAGNOSIS — R55 Syncope and collapse: Secondary | ICD-10-CM | POA: Insufficient documentation

## 2013-06-11 DIAGNOSIS — I6789 Other cerebrovascular disease: Secondary | ICD-10-CM | POA: Diagnosis not present

## 2013-06-11 DIAGNOSIS — S065X9A Traumatic subdural hemorrhage with loss of consciousness of unspecified duration, initial encounter: Secondary | ICD-10-CM | POA: Diagnosis not present

## 2013-06-11 DIAGNOSIS — S065XAA Traumatic subdural hemorrhage with loss of consciousness status unknown, initial encounter: Secondary | ICD-10-CM | POA: Insufficient documentation

## 2013-06-11 MED ORDER — GADOBENATE DIMEGLUMINE 529 MG/ML IV SOLN
15.0000 mL | Freq: Once | INTRAVENOUS | Status: AC | PRN
  Administered 2013-06-11: 14 mL via INTRAVENOUS

## 2013-06-23 ENCOUNTER — Ambulatory Visit (HOSPITAL_COMMUNITY)
Admission: RE | Admit: 2013-06-23 | Discharge: 2013-06-23 | Disposition: A | Discharge status: Home / Self Care | Payer: Medicare Other | Source: Ambulatory Visit | Attending: Neurology | Admitting: Neurology

## 2013-06-23 DIAGNOSIS — R55 Syncope and collapse: Secondary | ICD-10-CM

## 2013-06-23 DIAGNOSIS — R9401 Abnormal electroencephalogram [EEG]: Secondary | ICD-10-CM | POA: Insufficient documentation

## 2013-06-23 NOTE — Progress Notes (Signed)
24 hr Amb EEG started

## 2013-06-29 NOTE — Procedures (Signed)
TECHNICAL SUMMARY:  An 18 channel referential and bipolar montage ambulatory EEG was performed.  Total recording time was 24 hours, 13 minutes, 16 seconds.    The dominant background activity consists of 9-10 hertz activity seen most prominantly over the anterior head region.  Intermittent but frequent 5-7 hertz theta is noted in all head regions.  Subclinical rhythmical electrographic discharges of adults (SREDA) is noted.  This is a normal variant.  EPILEPTIFORM ACTIVITY:  There were no spikes, sharp waves or paroxysmal activity.  As above, subclinical rhythmical electrographic discharges of adults (SREDA) is noted.  This is a normal, benign variant.  CARDIAC:  The EKG lead revealed a regular sinus rhythm.  IMPRESSION:  This is a mildly abnormal EEG demonstrating mild diffuse slowing of electrocerebral activity.  This can be seen in a wide variety of encephalopathic states, including those of a toxic, metabolic, or degenerative nature. There is no focal, hemispheric, or lateralizing features.  No epileptiform activity was noted.  SREDA was present.

## 2013-07-03 ENCOUNTER — Telehealth: Payer: Self-pay | Admitting: Neurology

## 2013-07-03 NOTE — Telephone Encounter (Signed)
From my standpoint, don't anticipate expensive further tests.  Current tests look ok, we will discuss in more detail in follow up

## 2013-07-03 NOTE — Telephone Encounter (Signed)
Called pt and relayed your message. 

## 2013-07-03 NOTE — Telephone Encounter (Signed)
Pt is considering a change of insurance but wants to see if her test results are avail? If so, based on those results does it look like any major/expensive follow up / treatment anticipated in the future. Please call pt [phone] / Sherri S.

## 2013-07-06 DIAGNOSIS — I10 Essential (primary) hypertension: Secondary | ICD-10-CM | POA: Diagnosis not present

## 2013-07-07 ENCOUNTER — Encounter: Payer: Self-pay | Admitting: Neurology

## 2013-07-07 ENCOUNTER — Ambulatory Visit (INDEPENDENT_AMBULATORY_CARE_PROVIDER_SITE_OTHER): Payer: Medicare Other | Admitting: Neurology

## 2013-07-07 VITALS — BP 132/76 | HR 76 | Ht 64.0 in | Wt 146.0 lb

## 2013-07-07 DIAGNOSIS — R55 Syncope and collapse: Secondary | ICD-10-CM | POA: Diagnosis not present
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
## 2013-07-28 DIAGNOSIS — IMO0001 Reserved for inherently not codable concepts without codable children: Secondary | ICD-10-CM | POA: Diagnosis not present

## 2013-07-28 DIAGNOSIS — G894 Chronic pain syndrome: Secondary | ICD-10-CM | POA: Diagnosis not present

## 2013-07-28 DIAGNOSIS — M5137 Other intervertebral disc degeneration, lumbosacral region: Secondary | ICD-10-CM | POA: Diagnosis not present

## 2013-07-28 DIAGNOSIS — Z79899 Other long term (current) drug therapy: Secondary | ICD-10-CM | POA: Diagnosis not present

## 2013-08-24 DIAGNOSIS — M48061 Spinal stenosis, lumbar region without neurogenic claudication: Secondary | ICD-10-CM | POA: Diagnosis not present

## 2013-08-24 DIAGNOSIS — M5430 Sciatica, unspecified side: Secondary | ICD-10-CM | POA: Diagnosis not present

## 2013-09-01 ENCOUNTER — Ambulatory Visit (INDEPENDENT_AMBULATORY_CARE_PROVIDER_SITE_OTHER): Payer: Medicare Other | Admitting: Neurology

## 2013-09-01 ENCOUNTER — Encounter: Payer: Self-pay | Admitting: Neurology

## 2013-09-01 ENCOUNTER — Telehealth: Payer: Self-pay | Admitting: Neurology

## 2013-09-01 VITALS — BP 130/78 | HR 68 | Temp 98.0°F | Resp 18 | Ht 64.0 in | Wt 146.0 lb

## 2013-09-01 DIAGNOSIS — R55 Syncope and collapse: Secondary | ICD-10-CM | POA: Diagnosis not present
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
## 2013-09-01 NOTE — Telephone Encounter (Signed)
Pt came in today for a visit with Dr. Delice Lesch.  Pt upset during the visit and wanted to talk with me.  She wanted to drive and was under impression that she would be given go ahead after todays visit.  I reiterated what Dr. Delice Lesch had already told her.  I explained to her [place] driving laws.  Told her that even if this wasn't seizure and was just syncope, [place] driving laws were still 6 months.  She was understandably upset and asked questions but understood the law.  I explained to her that Dr. Delice Lesch could not access her EEG (nor could I today) and that I had already contacted the [facility] lab for help.  I am awaiting their response so that Dr. Delice Lesch can access that.  I told her that once Dr. Delice Lesch could access that she would call her.  However, I told her that no matter what the new interpretation (normal, abnormal), I did not think that it changed anything as far as her driving.  She understood.

## 2013-09-03 ENCOUNTER — Telehealth: Payer: Self-pay | Admitting: Neurology

## 2013-09-03 NOTE — Telephone Encounter (Signed)
Spoke to patient, discussed 24-hour EEG results, no epileptiform discharges, no clear indication for starting seizure medication at this time. Patient expressed understanding.

## 2013-09-09 DIAGNOSIS — M545 Low back pain, unspecified: Secondary | ICD-10-CM | POA: Diagnosis not present

## 2013-09-09 DIAGNOSIS — IMO0001 Reserved for inherently not codable concepts without codable children: Secondary | ICD-10-CM | POA: Diagnosis not present

## 2013-09-09 DIAGNOSIS — G4700 Insomnia, unspecified: Secondary | ICD-10-CM | POA: Diagnosis not present

## 2013-09-09 DIAGNOSIS — Z79899 Other long term (current) drug therapy: Secondary | ICD-10-CM | POA: Diagnosis not present

## 2013-09-09 DIAGNOSIS — G894 Chronic pain syndrome: Secondary | ICD-10-CM | POA: Diagnosis not present

## 2013-09-09 DIAGNOSIS — M5137 Other intervertebral disc degeneration, lumbosacral region: Secondary | ICD-10-CM | POA: Diagnosis not present

## 2013-09-09 DIAGNOSIS — F341 Dysthymic disorder: Secondary | ICD-10-CM | POA: Diagnosis not present

## 2013-09-28 ENCOUNTER — Other Ambulatory Visit (HOSPITAL_COMMUNITY): Payer: Self-pay | Admitting: Internal Medicine

## 2013-09-28 DIAGNOSIS — Z1231 Encounter for screening mammogram for malignant neoplasm of breast: Secondary | ICD-10-CM

## 2013-10-05 ENCOUNTER — Ambulatory Visit (HOSPITAL_COMMUNITY)
Admission: RE | Admit: 2013-10-05 | Discharge: 2013-10-05 | Disposition: A | Discharge status: Home / Self Care | Payer: Medicare Other | Source: Ambulatory Visit | Attending: Internal Medicine | Admitting: Internal Medicine

## 2013-10-05 DIAGNOSIS — Z1231 Encounter for screening mammogram for malignant neoplasm of breast: Secondary | ICD-10-CM

## 2013-10-15 DIAGNOSIS — M47817 Spondylosis without myelopathy or radiculopathy, lumbosacral region: Secondary | ICD-10-CM | POA: Diagnosis not present

## 2013-10-15 DIAGNOSIS — G894 Chronic pain syndrome: Secondary | ICD-10-CM | POA: Diagnosis not present

## 2013-10-15 DIAGNOSIS — M5137 Other intervertebral disc degeneration, lumbosacral region: Secondary | ICD-10-CM | POA: Diagnosis not present

## 2013-10-15 DIAGNOSIS — Z79899 Other long term (current) drug therapy: Secondary | ICD-10-CM | POA: Diagnosis not present

## 2013-10-27 ENCOUNTER — Telehealth: Payer: Self-pay | Admitting: Neurology

## 2013-10-27 NOTE — Telephone Encounter (Signed)
Pt wants to make appt for follow up and did not know if she was to see Dr Tat or Dr Delice Lesch. Please let me know so that I can call and make her return appt. She states that she would rather see Dr Tat if possible. I will be happy to call and make her follow up appt  Just let me know which Dr thanks

## 2013-10-27 NOTE — Telephone Encounter (Signed)
From the notes, it looks like patient should see Dr Delice Lesch. Thanks.

## 2013-11-10 ENCOUNTER — Telehealth: Payer: Self-pay | Admitting: Neurology

## 2013-11-10 NOTE — Telephone Encounter (Signed)
This is Dr. Aquino's patient.  

## 2013-11-10 NOTE — Telephone Encounter (Signed)
Pt states that she has seen you and Dr Delice Lesch and wants to know if there is any reason that she would be told she cant drive when she comes in may 4th. She said everything is the same and hasnot changes and has had no eps . Please call [phone] or [phone] she is car shopping and just needs to know
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
## 2013-11-11 NOTE — Telephone Encounter (Signed)
Unable to reach patient will try again later 

## 2013-11-13 NOTE — Telephone Encounter (Signed)
Spoke with patient.

## 2013-11-19 DIAGNOSIS — IMO0001 Reserved for inherently not codable concepts without codable children: Secondary | ICD-10-CM | POA: Diagnosis not present

## 2013-11-19 DIAGNOSIS — M5137 Other intervertebral disc degeneration, lumbosacral region: Secondary | ICD-10-CM | POA: Diagnosis not present

## 2013-11-19 DIAGNOSIS — G894 Chronic pain syndrome: Secondary | ICD-10-CM | POA: Diagnosis not present

## 2013-11-19 DIAGNOSIS — M47817 Spondylosis without myelopathy or radiculopathy, lumbosacral region: Secondary | ICD-10-CM | POA: Diagnosis not present

## 2013-11-19 DIAGNOSIS — Z79899 Other long term (current) drug therapy: Secondary | ICD-10-CM | POA: Diagnosis not present

## 2013-11-30 ENCOUNTER — Ambulatory Visit (INDEPENDENT_AMBULATORY_CARE_PROVIDER_SITE_OTHER): Payer: Medicare Other | Admitting: Neurology

## 2013-11-30 ENCOUNTER — Encounter: Payer: Self-pay | Admitting: Neurology

## 2013-11-30 VITALS — BP 122/78 | HR 60 | Resp 14 | Ht 64.0 in | Wt 153.0 lb

## 2013-11-30 DIAGNOSIS — R55 Syncope and collapse: Secondary | ICD-10-CM | POA: Diagnosis not present

## 2013-11-30 NOTE — Patient Instructions (Signed)
1. Please call our office if recurrent symptoms occur
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
## 2013-12-01 DIAGNOSIS — M5137 Other intervertebral disc degeneration, lumbosacral region: Secondary | ICD-10-CM | POA: Diagnosis not present
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
## 2014-01-19 DIAGNOSIS — M79609 Pain in unspecified limb: Secondary | ICD-10-CM | POA: Diagnosis not present

## 2014-01-19 DIAGNOSIS — S92309A Fracture of unspecified metatarsal bone(s), unspecified foot, initial encounter for closed fracture: Secondary | ICD-10-CM | POA: Diagnosis not present

## 2014-01-19 DIAGNOSIS — F411 Generalized anxiety disorder: Secondary | ICD-10-CM | POA: Diagnosis not present

## 2014-02-04 DIAGNOSIS — R197 Diarrhea, unspecified: Secondary | ICD-10-CM | POA: Diagnosis not present

## 2014-02-05 DIAGNOSIS — R197 Diarrhea, unspecified: Secondary | ICD-10-CM | POA: Diagnosis not present

## 2014-02-10 DIAGNOSIS — D649 Anemia, unspecified: Secondary | ICD-10-CM | POA: Diagnosis not present

## 2014-02-10 DIAGNOSIS — R197 Diarrhea, unspecified: Secondary | ICD-10-CM | POA: Diagnosis not present

## 2014-02-11 DIAGNOSIS — D509 Iron deficiency anemia, unspecified: Secondary | ICD-10-CM | POA: Diagnosis not present

## 2014-02-11 DIAGNOSIS — R197 Diarrhea, unspecified: Secondary | ICD-10-CM | POA: Diagnosis not present

## 2014-02-12 DIAGNOSIS — D508 Other iron deficiency anemias: Secondary | ICD-10-CM | POA: Diagnosis not present

## 2014-02-12 DIAGNOSIS — K529 Noninfective gastroenteritis and colitis, unspecified: Secondary | ICD-10-CM | POA: Diagnosis not present

## 2014-02-18 DIAGNOSIS — K5289 Other specified noninfective gastroenteritis and colitis: Secondary | ICD-10-CM | POA: Diagnosis not present

## 2014-02-18 DIAGNOSIS — R197 Diarrhea, unspecified: Secondary | ICD-10-CM | POA: Diagnosis not present

## 2014-03-08 DIAGNOSIS — S92309A Fracture of unspecified metatarsal bone(s), unspecified foot, initial encounter for closed fracture: Secondary | ICD-10-CM | POA: Diagnosis not present

## 2014-03-18 DIAGNOSIS — M1990 Unspecified osteoarthritis, unspecified site: Secondary | ICD-10-CM | POA: Diagnosis not present

## 2014-03-18 DIAGNOSIS — M5137 Other intervertebral disc degeneration, lumbosacral region: Secondary | ICD-10-CM | POA: Diagnosis not present

## 2014-03-18 DIAGNOSIS — Z79899 Other long term (current) drug therapy: Secondary | ICD-10-CM | POA: Diagnosis not present

## 2014-03-18 DIAGNOSIS — G894 Chronic pain syndrome: Secondary | ICD-10-CM | POA: Diagnosis not present

## 2014-03-24 DIAGNOSIS — M4310 Spondylolisthesis, site unspecified: Secondary | ICD-10-CM | POA: Diagnosis not present

## 2014-04-08 DIAGNOSIS — M47817 Spondylosis without myelopathy or radiculopathy, lumbosacral region: Secondary | ICD-10-CM | POA: Diagnosis not present

## 2014-04-15 DIAGNOSIS — M79609 Pain in unspecified limb: Secondary | ICD-10-CM | POA: Diagnosis not present

## 2014-04-15 DIAGNOSIS — Z79899 Other long term (current) drug therapy: Secondary | ICD-10-CM | POA: Diagnosis not present

## 2014-04-15 DIAGNOSIS — G894 Chronic pain syndrome: Secondary | ICD-10-CM | POA: Diagnosis not present

## 2014-04-15 DIAGNOSIS — M5137 Other intervertebral disc degeneration, lumbosacral region: Secondary | ICD-10-CM | POA: Diagnosis not present

## 2014-05-03 DIAGNOSIS — Z23 Encounter for immunization: Secondary | ICD-10-CM | POA: Diagnosis not present

## 2014-05-03 DIAGNOSIS — Z0000 Encounter for general adult medical examination without abnormal findings: Secondary | ICD-10-CM | POA: Diagnosis not present

## 2014-05-04 DIAGNOSIS — E780 Pure hypercholesterolemia: Secondary | ICD-10-CM | POA: Diagnosis not present

## 2014-05-04 DIAGNOSIS — I10 Essential (primary) hypertension: Secondary | ICD-10-CM | POA: Diagnosis not present

## 2014-05-04 DIAGNOSIS — E559 Vitamin D deficiency, unspecified: Secondary | ICD-10-CM | POA: Diagnosis not present

## 2014-05-06 DIAGNOSIS — F419 Anxiety disorder, unspecified: Secondary | ICD-10-CM | POA: Diagnosis not present

## 2014-05-06 DIAGNOSIS — Z23 Encounter for immunization: Secondary | ICD-10-CM | POA: Diagnosis not present

## 2014-05-06 DIAGNOSIS — I10 Essential (primary) hypertension: Secondary | ICD-10-CM | POA: Diagnosis not present

## 2014-05-06 DIAGNOSIS — R351 Nocturia: Secondary | ICD-10-CM | POA: Diagnosis not present

## 2014-05-06 DIAGNOSIS — E780 Pure hypercholesterolemia: Secondary | ICD-10-CM | POA: Diagnosis not present

## 2014-05-13 DIAGNOSIS — M5137 Other intervertebral disc degeneration, lumbosacral region: Secondary | ICD-10-CM | POA: Diagnosis not present

## 2014-06-10 DIAGNOSIS — M5137 Other intervertebral disc degeneration, lumbosacral region: Secondary | ICD-10-CM | POA: Diagnosis not present

## 2014-07-08 DIAGNOSIS — M5137 Other intervertebral disc degeneration, lumbosacral region: Secondary | ICD-10-CM | POA: Diagnosis not present

## 2014-07-13 DIAGNOSIS — M9901 Segmental and somatic dysfunction of cervical region: Secondary | ICD-10-CM | POA: Diagnosis not present

## 2014-07-13 DIAGNOSIS — M5032 Other cervical disc degeneration, mid-cervical region: Secondary | ICD-10-CM | POA: Diagnosis not present

## 2014-07-13 DIAGNOSIS — F5101 Primary insomnia: Secondary | ICD-10-CM | POA: Diagnosis not present

## 2014-07-14 DIAGNOSIS — M9901 Segmental and somatic dysfunction of cervical region: Secondary | ICD-10-CM | POA: Diagnosis not present

## 2014-07-14 DIAGNOSIS — M5032 Other cervical disc degeneration, mid-cervical region: Secondary | ICD-10-CM | POA: Diagnosis not present

## 2014-07-19 DIAGNOSIS — M9901 Segmental and somatic dysfunction of cervical region: Secondary | ICD-10-CM | POA: Diagnosis not present

## 2014-07-19 DIAGNOSIS — M5032 Other cervical disc degeneration, mid-cervical region: Secondary | ICD-10-CM | POA: Diagnosis not present

## 2014-07-21 DIAGNOSIS — M5032 Other cervical disc degeneration, mid-cervical region: Secondary | ICD-10-CM | POA: Diagnosis not present

## 2014-07-21 DIAGNOSIS — M9901 Segmental and somatic dysfunction of cervical region: Secondary | ICD-10-CM | POA: Diagnosis not present

## 2014-07-26 DIAGNOSIS — M9901 Segmental and somatic dysfunction of cervical region: Secondary | ICD-10-CM | POA: Diagnosis not present

## 2014-07-26 DIAGNOSIS — M5032 Other cervical disc degeneration, mid-cervical region: Secondary | ICD-10-CM | POA: Diagnosis not present

## 2014-07-28 DIAGNOSIS — M9901 Segmental and somatic dysfunction of cervical region: Secondary | ICD-10-CM | POA: Diagnosis not present

## 2014-07-28 DIAGNOSIS — M5032 Other cervical disc degeneration, mid-cervical region: Secondary | ICD-10-CM | POA: Diagnosis not present

## 2014-08-02 DIAGNOSIS — M5032 Other cervical disc degeneration, mid-cervical region: Secondary | ICD-10-CM | POA: Diagnosis not present

## 2014-08-02 DIAGNOSIS — M9901 Segmental and somatic dysfunction of cervical region: Secondary | ICD-10-CM | POA: Diagnosis not present

## 2014-08-05 DIAGNOSIS — M545 Low back pain: Secondary | ICD-10-CM | POA: Diagnosis not present

## 2014-08-05 DIAGNOSIS — G894 Chronic pain syndrome: Secondary | ICD-10-CM | POA: Diagnosis not present

## 2014-08-05 DIAGNOSIS — Z79899 Other long term (current) drug therapy: Secondary | ICD-10-CM | POA: Diagnosis not present

## 2014-08-17 DIAGNOSIS — I10 Essential (primary) hypertension: Secondary | ICD-10-CM | POA: Diagnosis not present

## 2014-08-17 DIAGNOSIS — F5101 Primary insomnia: Secondary | ICD-10-CM | POA: Diagnosis not present

## 2014-08-17 DIAGNOSIS — F419 Anxiety disorder, unspecified: Secondary | ICD-10-CM | POA: Diagnosis not present

## 2014-08-26 DIAGNOSIS — M5137 Other intervertebral disc degeneration, lumbosacral region: Secondary | ICD-10-CM | POA: Diagnosis not present

## 2014-08-26 DIAGNOSIS — M545 Low back pain: Secondary | ICD-10-CM | POA: Diagnosis not present

## 2014-08-26 DIAGNOSIS — M47817 Spondylosis without myelopathy or radiculopathy, lumbosacral region: Secondary | ICD-10-CM | POA: Diagnosis not present

## 2014-09-24 DIAGNOSIS — M545 Low back pain: Secondary | ICD-10-CM | POA: Diagnosis not present

## 2014-09-24 DIAGNOSIS — M47817 Spondylosis without myelopathy or radiculopathy, lumbosacral region: Secondary | ICD-10-CM | POA: Diagnosis not present

## 2014-09-24 DIAGNOSIS — M5137 Other intervertebral disc degeneration, lumbosacral region: Secondary | ICD-10-CM | POA: Diagnosis not present

## 2014-10-04 DIAGNOSIS — F5101 Primary insomnia: Secondary | ICD-10-CM | POA: Diagnosis not present

## 2014-10-04 DIAGNOSIS — F419 Anxiety disorder, unspecified: Secondary | ICD-10-CM | POA: Diagnosis not present

## 2014-10-08 DIAGNOSIS — M5137 Other intervertebral disc degeneration, lumbosacral region: Secondary | ICD-10-CM | POA: Diagnosis not present

## 2014-10-08 DIAGNOSIS — M545 Low back pain: Secondary | ICD-10-CM | POA: Diagnosis not present

## 2014-10-08 DIAGNOSIS — M47817 Spondylosis without myelopathy or radiculopathy, lumbosacral region: Secondary | ICD-10-CM | POA: Diagnosis not present

## 2014-11-02 ENCOUNTER — Ambulatory Visit: Payer: Medicare Other | Admitting: Podiatry

## 2014-11-16 DIAGNOSIS — F5101 Primary insomnia: Secondary | ICD-10-CM | POA: Diagnosis not present

## 2014-11-16 DIAGNOSIS — F419 Anxiety disorder, unspecified: Secondary | ICD-10-CM | POA: Diagnosis not present

## 2014-11-16 DIAGNOSIS — I10 Essential (primary) hypertension: Secondary | ICD-10-CM | POA: Diagnosis not present

## 2014-11-19 DIAGNOSIS — G894 Chronic pain syndrome: Secondary | ICD-10-CM | POA: Diagnosis not present

## 2014-11-19 DIAGNOSIS — Z79899 Other long term (current) drug therapy: Secondary | ICD-10-CM | POA: Diagnosis not present

## 2014-11-19 DIAGNOSIS — M79606 Pain in leg, unspecified: Secondary | ICD-10-CM | POA: Diagnosis not present

## 2014-11-19 DIAGNOSIS — M5136 Other intervertebral disc degeneration, lumbar region: Secondary | ICD-10-CM | POA: Diagnosis not present

## 2014-11-19 DIAGNOSIS — M1990 Unspecified osteoarthritis, unspecified site: Secondary | ICD-10-CM | POA: Diagnosis not present

## 2015-01-03 DIAGNOSIS — B349 Viral infection, unspecified: Secondary | ICD-10-CM | POA: Diagnosis not present

## 2015-01-13 DIAGNOSIS — Z79899 Other long term (current) drug therapy: Secondary | ICD-10-CM | POA: Diagnosis not present

## 2015-01-13 DIAGNOSIS — M5136 Other intervertebral disc degeneration, lumbar region: Secondary | ICD-10-CM | POA: Diagnosis not present

## 2015-01-13 DIAGNOSIS — M47817 Spondylosis without myelopathy or radiculopathy, lumbosacral region: Secondary | ICD-10-CM | POA: Diagnosis not present

## 2015-01-13 DIAGNOSIS — G894 Chronic pain syndrome: Secondary | ICD-10-CM | POA: Diagnosis not present

## 2015-01-13 DIAGNOSIS — M1990 Unspecified osteoarthritis, unspecified site: Secondary | ICD-10-CM | POA: Diagnosis not present

## 2015-01-13 DIAGNOSIS — M545 Low back pain: Secondary | ICD-10-CM | POA: Diagnosis not present

## 2015-01-24 ENCOUNTER — Other Ambulatory Visit: Payer: Self-pay

## 2015-03-10 DIAGNOSIS — M5136 Other intervertebral disc degeneration, lumbar region: Secondary | ICD-10-CM | POA: Diagnosis not present

## 2015-03-10 DIAGNOSIS — M25579 Pain in unspecified ankle and joints of unspecified foot: Secondary | ICD-10-CM | POA: Diagnosis not present

## 2015-03-10 DIAGNOSIS — Z79899 Other long term (current) drug therapy: Secondary | ICD-10-CM | POA: Diagnosis not present

## 2015-03-10 DIAGNOSIS — G894 Chronic pain syndrome: Secondary | ICD-10-CM | POA: Diagnosis not present

## 2015-03-10 DIAGNOSIS — M1990 Unspecified osteoarthritis, unspecified site: Secondary | ICD-10-CM | POA: Diagnosis not present

## 2015-03-10 DIAGNOSIS — M47817 Spondylosis without myelopathy or radiculopathy, lumbosacral region: Secondary | ICD-10-CM | POA: Diagnosis not present

## 2015-03-28 DIAGNOSIS — M47817 Spondylosis without myelopathy or radiculopathy, lumbosacral region: Secondary | ICD-10-CM | POA: Diagnosis not present

## 2015-04-25 DIAGNOSIS — G894 Chronic pain syndrome: Secondary | ICD-10-CM | POA: Diagnosis not present

## 2015-04-25 DIAGNOSIS — M545 Low back pain: Secondary | ICD-10-CM | POA: Diagnosis not present

## 2015-04-25 DIAGNOSIS — M5137 Other intervertebral disc degeneration, lumbosacral region: Secondary | ICD-10-CM | POA: Diagnosis not present

## 2015-04-25 DIAGNOSIS — Z79899 Other long term (current) drug therapy: Secondary | ICD-10-CM | POA: Diagnosis not present

## 2015-04-25 DIAGNOSIS — M47817 Spondylosis without myelopathy or radiculopathy, lumbosacral region: Secondary | ICD-10-CM | POA: Diagnosis not present

## 2015-05-23 DIAGNOSIS — M47817 Spondylosis without myelopathy or radiculopathy, lumbosacral region: Secondary | ICD-10-CM | POA: Diagnosis not present

## 2015-05-23 DIAGNOSIS — G894 Chronic pain syndrome: Secondary | ICD-10-CM | POA: Diagnosis not present

## 2015-05-23 DIAGNOSIS — M5137 Other intervertebral disc degeneration, lumbosacral region: Secondary | ICD-10-CM | POA: Diagnosis not present

## 2015-05-23 DIAGNOSIS — M545 Low back pain: Secondary | ICD-10-CM | POA: Diagnosis not present

## 2015-05-23 DIAGNOSIS — Z79899 Other long term (current) drug therapy: Secondary | ICD-10-CM | POA: Diagnosis not present

## 2015-05-25 DIAGNOSIS — M5137 Other intervertebral disc degeneration, lumbosacral region: Secondary | ICD-10-CM | POA: Diagnosis not present

## 2015-06-07 DIAGNOSIS — Z0000 Encounter for general adult medical examination without abnormal findings: Secondary | ICD-10-CM | POA: Diagnosis not present

## 2015-06-07 DIAGNOSIS — R197 Diarrhea, unspecified: Secondary | ICD-10-CM | POA: Diagnosis not present

## 2015-06-07 DIAGNOSIS — E7800 Pure hypercholesterolemia, unspecified: Secondary | ICD-10-CM | POA: Diagnosis not present

## 2015-06-07 DIAGNOSIS — D649 Anemia, unspecified: Secondary | ICD-10-CM | POA: Diagnosis not present

## 2015-06-07 DIAGNOSIS — I10 Essential (primary) hypertension: Secondary | ICD-10-CM | POA: Diagnosis not present

## 2015-06-13 DIAGNOSIS — M539 Dorsopathy, unspecified: Secondary | ICD-10-CM | POA: Diagnosis not present

## 2015-06-13 DIAGNOSIS — Z23 Encounter for immunization: Secondary | ICD-10-CM | POA: Diagnosis not present

## 2015-06-13 DIAGNOSIS — G43909 Migraine, unspecified, not intractable, without status migrainosus: Secondary | ICD-10-CM | POA: Diagnosis not present

## 2015-06-13 DIAGNOSIS — I10 Essential (primary) hypertension: Secondary | ICD-10-CM | POA: Diagnosis not present

## 2015-06-13 DIAGNOSIS — M542 Cervicalgia: Secondary | ICD-10-CM | POA: Diagnosis not present

## 2015-06-30 DIAGNOSIS — M5417 Radiculopathy, lumbosacral region: Secondary | ICD-10-CM | POA: Diagnosis not present

## 2015-07-04 DIAGNOSIS — M47817 Spondylosis without myelopathy or radiculopathy, lumbosacral region: Secondary | ICD-10-CM | POA: Diagnosis not present

## 2015-07-04 DIAGNOSIS — M5137 Other intervertebral disc degeneration, lumbosacral region: Secondary | ICD-10-CM | POA: Diagnosis not present

## 2015-07-04 DIAGNOSIS — Z79899 Other long term (current) drug therapy: Secondary | ICD-10-CM | POA: Diagnosis not present

## 2015-07-04 DIAGNOSIS — M545 Low back pain: Secondary | ICD-10-CM | POA: Diagnosis not present

## 2015-07-04 DIAGNOSIS — G894 Chronic pain syndrome: Secondary | ICD-10-CM | POA: Diagnosis not present

## 2015-08-09 DIAGNOSIS — M5137 Other intervertebral disc degeneration, lumbosacral region: Secondary | ICD-10-CM | POA: Diagnosis not present

## 2015-08-11 DIAGNOSIS — E663 Overweight: Secondary | ICD-10-CM | POA: Diagnosis not present

## 2015-08-11 DIAGNOSIS — R030 Elevated blood-pressure reading, without diagnosis of hypertension: Secondary | ICD-10-CM | POA: Diagnosis not present

## 2015-10-04 DIAGNOSIS — G894 Chronic pain syndrome: Secondary | ICD-10-CM | POA: Diagnosis not present

## 2015-10-04 DIAGNOSIS — M5137 Other intervertebral disc degeneration, lumbosacral region: Secondary | ICD-10-CM | POA: Diagnosis not present

## 2015-10-04 DIAGNOSIS — Z79899 Other long term (current) drug therapy: Secondary | ICD-10-CM | POA: Diagnosis not present

## 2015-12-09 DIAGNOSIS — M545 Low back pain: Secondary | ICD-10-CM | POA: Diagnosis not present

## 2015-12-09 DIAGNOSIS — Z79891 Long term (current) use of opiate analgesic: Secondary | ICD-10-CM | POA: Diagnosis not present

## 2015-12-09 DIAGNOSIS — Z79899 Other long term (current) drug therapy: Secondary | ICD-10-CM | POA: Diagnosis not present

## 2015-12-09 DIAGNOSIS — M5137 Other intervertebral disc degeneration, lumbosacral region: Secondary | ICD-10-CM | POA: Diagnosis not present

## 2015-12-09 DIAGNOSIS — M79606 Pain in leg, unspecified: Secondary | ICD-10-CM | POA: Diagnosis not present

## 2015-12-09 DIAGNOSIS — G894 Chronic pain syndrome: Secondary | ICD-10-CM | POA: Diagnosis not present

## 2015-12-09 DIAGNOSIS — M47817 Spondylosis without myelopathy or radiculopathy, lumbosacral region: Secondary | ICD-10-CM | POA: Diagnosis not present

## 2016-01-03 DIAGNOSIS — K52838 Other microscopic colitis: Secondary | ICD-10-CM | POA: Diagnosis not present

## 2016-01-03 DIAGNOSIS — K5289 Other specified noninfective gastroenteritis and colitis: Secondary | ICD-10-CM | POA: Diagnosis not present

## 2016-01-04 DIAGNOSIS — R197 Diarrhea, unspecified: Secondary | ICD-10-CM | POA: Diagnosis not present

## 2016-01-04 DIAGNOSIS — K52839 Microscopic colitis, unspecified: Secondary | ICD-10-CM | POA: Diagnosis not present

## 2016-01-06 DIAGNOSIS — Z79899 Other long term (current) drug therapy: Secondary | ICD-10-CM | POA: Diagnosis not present

## 2016-01-06 DIAGNOSIS — M545 Low back pain: Secondary | ICD-10-CM | POA: Diagnosis not present

## 2016-01-06 DIAGNOSIS — Z79891 Long term (current) use of opiate analgesic: Secondary | ICD-10-CM | POA: Diagnosis not present

## 2016-01-06 DIAGNOSIS — M5137 Other intervertebral disc degeneration, lumbosacral region: Secondary | ICD-10-CM | POA: Diagnosis not present

## 2016-01-06 DIAGNOSIS — G894 Chronic pain syndrome: Secondary | ICD-10-CM | POA: Diagnosis not present

## 2016-01-06 DIAGNOSIS — M47817 Spondylosis without myelopathy or radiculopathy, lumbosacral region: Secondary | ICD-10-CM | POA: Diagnosis not present

## 2016-01-10 DIAGNOSIS — S8010XA Contusion of unspecified lower leg, initial encounter: Secondary | ICD-10-CM | POA: Diagnosis not present

## 2016-01-20 DIAGNOSIS — L0231 Cutaneous abscess of buttock: Secondary | ICD-10-CM | POA: Diagnosis not present

## 2016-01-20 DIAGNOSIS — Z22322 Carrier or suspected carrier of Methicillin resistant Staphylococcus aureus: Secondary | ICD-10-CM | POA: Diagnosis not present

## 2016-01-20 DIAGNOSIS — A09 Infectious gastroenteritis and colitis, unspecified: Secondary | ICD-10-CM | POA: Diagnosis not present

## 2016-01-27 DIAGNOSIS — Z22322 Carrier or suspected carrier of Methicillin resistant Staphylococcus aureus: Secondary | ICD-10-CM | POA: Diagnosis not present

## 2016-01-27 DIAGNOSIS — L0231 Cutaneous abscess of buttock: Secondary | ICD-10-CM | POA: Diagnosis not present

## 2016-02-03 DIAGNOSIS — Z79899 Other long term (current) drug therapy: Secondary | ICD-10-CM | POA: Diagnosis not present

## 2016-02-03 DIAGNOSIS — M545 Low back pain: Secondary | ICD-10-CM | POA: Diagnosis not present

## 2016-02-03 DIAGNOSIS — M47817 Spondylosis without myelopathy or radiculopathy, lumbosacral region: Secondary | ICD-10-CM | POA: Diagnosis not present

## 2016-02-03 DIAGNOSIS — G894 Chronic pain syndrome: Secondary | ICD-10-CM | POA: Diagnosis not present

## 2016-02-03 DIAGNOSIS — F411 Generalized anxiety disorder: Secondary | ICD-10-CM | POA: Diagnosis not present

## 2016-02-03 DIAGNOSIS — M79606 Pain in leg, unspecified: Secondary | ICD-10-CM | POA: Diagnosis not present

## 2016-02-03 DIAGNOSIS — Z79891 Long term (current) use of opiate analgesic: Secondary | ICD-10-CM | POA: Diagnosis not present

## 2016-02-03 DIAGNOSIS — M5137 Other intervertebral disc degeneration, lumbosacral region: Secondary | ICD-10-CM | POA: Diagnosis not present

## 2016-02-20 DIAGNOSIS — A09 Infectious gastroenteritis and colitis, unspecified: Secondary | ICD-10-CM | POA: Diagnosis not present

## 2016-03-08 DIAGNOSIS — M47817 Spondylosis without myelopathy or radiculopathy, lumbosacral region: Secondary | ICD-10-CM | POA: Diagnosis not present

## 2016-03-08 DIAGNOSIS — G894 Chronic pain syndrome: Secondary | ICD-10-CM | POA: Diagnosis not present

## 2016-03-08 DIAGNOSIS — Z79891 Long term (current) use of opiate analgesic: Secondary | ICD-10-CM | POA: Diagnosis not present

## 2016-03-08 DIAGNOSIS — M5137 Other intervertebral disc degeneration, lumbosacral region: Secondary | ICD-10-CM | POA: Diagnosis not present

## 2016-03-08 DIAGNOSIS — M79606 Pain in leg, unspecified: Secondary | ICD-10-CM | POA: Diagnosis not present

## 2016-03-08 DIAGNOSIS — Z79899 Other long term (current) drug therapy: Secondary | ICD-10-CM | POA: Diagnosis not present

## 2016-03-30 DIAGNOSIS — M79606 Pain in leg, unspecified: Secondary | ICD-10-CM | POA: Diagnosis not present

## 2016-03-30 DIAGNOSIS — M47817 Spondylosis without myelopathy or radiculopathy, lumbosacral region: Secondary | ICD-10-CM | POA: Diagnosis not present

## 2016-03-30 DIAGNOSIS — M5137 Other intervertebral disc degeneration, lumbosacral region: Secondary | ICD-10-CM | POA: Diagnosis not present

## 2016-03-30 DIAGNOSIS — Z79891 Long term (current) use of opiate analgesic: Secondary | ICD-10-CM | POA: Diagnosis not present

## 2016-03-30 DIAGNOSIS — Z79899 Other long term (current) drug therapy: Secondary | ICD-10-CM | POA: Diagnosis not present

## 2016-03-30 DIAGNOSIS — G894 Chronic pain syndrome: Secondary | ICD-10-CM | POA: Diagnosis not present

## 2016-04-04 DIAGNOSIS — M5441 Lumbago with sciatica, right side: Secondary | ICD-10-CM | POA: Diagnosis not present

## 2016-04-04 DIAGNOSIS — F411 Generalized anxiety disorder: Secondary | ICD-10-CM | POA: Diagnosis not present

## 2016-04-04 DIAGNOSIS — Z23 Encounter for immunization: Secondary | ICD-10-CM | POA: Diagnosis not present

## 2016-04-04 DIAGNOSIS — R030 Elevated blood-pressure reading, without diagnosis of hypertension: Secondary | ICD-10-CM | POA: Diagnosis not present

## 2016-04-12 DIAGNOSIS — I10 Essential (primary) hypertension: Secondary | ICD-10-CM | POA: Diagnosis not present

## 2016-05-14 ENCOUNTER — Ambulatory Visit (INDEPENDENT_AMBULATORY_CARE_PROVIDER_SITE_OTHER): Payer: Medicare Other | Admitting: Specialist

## 2016-05-14 DIAGNOSIS — M4317 Spondylolisthesis, lumbosacral region: Secondary | ICD-10-CM | POA: Diagnosis not present

## 2016-05-14 DIAGNOSIS — M48062 Spinal stenosis, lumbar region with neurogenic claudication: Secondary | ICD-10-CM | POA: Diagnosis not present

## 2016-05-19 ENCOUNTER — Other Ambulatory Visit (INDEPENDENT_AMBULATORY_CARE_PROVIDER_SITE_OTHER): Payer: Self-pay | Admitting: Specialist

## 2016-05-19 ENCOUNTER — Other Ambulatory Visit: Payer: Self-pay | Admitting: Internal Medicine

## 2016-05-19 DIAGNOSIS — M48061 Spinal stenosis, lumbar region without neurogenic claudication: Secondary | ICD-10-CM

## 2016-05-19 DIAGNOSIS — Z1231 Encounter for screening mammogram for malignant neoplasm of breast: Secondary | ICD-10-CM

## 2016-05-19 DIAGNOSIS — M5440 Lumbago with sciatica, unspecified side: Secondary | ICD-10-CM

## 2016-05-21 DIAGNOSIS — F329 Major depressive disorder, single episode, unspecified: Secondary | ICD-10-CM | POA: Diagnosis not present

## 2016-05-21 DIAGNOSIS — G8929 Other chronic pain: Secondary | ICD-10-CM | POA: Diagnosis not present

## 2016-05-22 ENCOUNTER — Ambulatory Visit
Admission: RE | Admit: 2016-05-22 | Discharge: 2016-05-22 | Disposition: A | Discharge status: Home / Self Care | Payer: Medicare Other | Source: Ambulatory Visit | Attending: Internal Medicine | Admitting: Internal Medicine

## 2016-05-22 DIAGNOSIS — Z1231 Encounter for screening mammogram for malignant neoplasm of breast: Secondary | ICD-10-CM | POA: Diagnosis not present

## 2016-05-25 DIAGNOSIS — M47817 Spondylosis without myelopathy or radiculopathy, lumbosacral region: Secondary | ICD-10-CM | POA: Diagnosis not present

## 2016-05-25 DIAGNOSIS — Z79899 Other long term (current) drug therapy: Secondary | ICD-10-CM | POA: Diagnosis not present

## 2016-05-25 DIAGNOSIS — M5137 Other intervertebral disc degeneration, lumbosacral region: Secondary | ICD-10-CM | POA: Diagnosis not present

## 2016-05-25 DIAGNOSIS — M545 Low back pain: Secondary | ICD-10-CM | POA: Diagnosis not present

## 2016-05-25 DIAGNOSIS — G894 Chronic pain syndrome: Secondary | ICD-10-CM | POA: Diagnosis not present

## 2016-05-25 DIAGNOSIS — Z79891 Long term (current) use of opiate analgesic: Secondary | ICD-10-CM | POA: Diagnosis not present

## 2016-05-30 ENCOUNTER — Telehealth (INDEPENDENT_AMBULATORY_CARE_PROVIDER_SITE_OTHER): Payer: Self-pay

## 2016-05-30 ENCOUNTER — Ambulatory Visit
Admission: RE | Admit: 2016-05-30 | Discharge: 2016-05-30 | Disposition: A | Discharge status: Home / Self Care | Payer: Medicare Other | Source: Ambulatory Visit | Attending: Specialist | Admitting: Specialist

## 2016-05-30 DIAGNOSIS — M48061 Spinal stenosis, lumbar region without neurogenic claudication: Secondary | ICD-10-CM

## 2016-05-30 DIAGNOSIS — M5126 Other intervertebral disc displacement, lumbar region: Secondary | ICD-10-CM | POA: Diagnosis not present

## 2016-05-30 DIAGNOSIS — M5440 Lumbago with sciatica, unspecified side: Secondary | ICD-10-CM

## 2016-05-30 NOTE — Telephone Encounter (Signed)
Pt wants a call about her MRI results.  Her appt is not till 11/30 and wants to come in earlier.

## 2016-06-01 NOTE — Telephone Encounter (Signed)
Called patient and she is scheduled to come in on Tuesday to review the MRI.

## 2016-06-05 ENCOUNTER — Encounter (INDEPENDENT_AMBULATORY_CARE_PROVIDER_SITE_OTHER): Payer: Self-pay | Admitting: Specialist

## 2016-06-05 ENCOUNTER — Ambulatory Visit (INDEPENDENT_AMBULATORY_CARE_PROVIDER_SITE_OTHER): Payer: Medicare Other | Admitting: Specialist

## 2016-06-05 DIAGNOSIS — M5136 Other intervertebral disc degeneration, lumbar region: Secondary | ICD-10-CM

## 2016-06-05 DIAGNOSIS — M48062 Spinal stenosis, lumbar region with neurogenic claudication: Secondary | ICD-10-CM

## 2016-06-05 DIAGNOSIS — M4316 Spondylolisthesis, lumbar region: Secondary | ICD-10-CM | POA: Diagnosis not present

## 2016-06-05 MED ORDER — DICLOFENAC POTASSIUM 50 MG PO TABS: 50.0000 mg | ORAL_TABLET | Freq: Three times a day (TID) | ORAL | 3 refills | Status: DC

## 2016-06-05 NOTE — Patient Instructions (Signed)
Avoid bending, stooping and avoid lifting weights greater than 10 lbs. Avoid prolong standing and walking. Stationary bike for exercise or pool walking exercise. NSAIDs help with arthritis pain.
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
## 2016-06-12 ENCOUNTER — Telehealth (INDEPENDENT_AMBULATORY_CARE_PROVIDER_SITE_OTHER): Payer: Self-pay | Admitting: Specialist

## 2016-06-12 NOTE — Telephone Encounter (Signed)
Patient wants MRI on disc and will pick up.  (she completed release form for records and indicated wants MRI also. The records are at the front. When I called pt to advise, mailbox full and unable to leave msg)...[phone]

## 2016-06-13 DIAGNOSIS — J329 Chronic sinusitis, unspecified: Secondary | ICD-10-CM | POA: Diagnosis not present

## 2016-06-15 NOTE — Telephone Encounter (Signed)
I tried to call patient but went to vm but mailbox was full.  CD is ready for pick up at the front desk.

## 2016-06-19 DIAGNOSIS — E7800 Pure hypercholesterolemia, unspecified: Secondary | ICD-10-CM | POA: Diagnosis not present

## 2016-06-19 DIAGNOSIS — E559 Vitamin D deficiency, unspecified: Secondary | ICD-10-CM | POA: Diagnosis not present

## 2016-06-19 DIAGNOSIS — Z0000 Encounter for general adult medical examination without abnormal findings: Secondary | ICD-10-CM | POA: Diagnosis not present

## 2016-06-19 DIAGNOSIS — I10 Essential (primary) hypertension: Secondary | ICD-10-CM | POA: Diagnosis not present

## 2016-06-19 DIAGNOSIS — M810 Age-related osteoporosis without current pathological fracture: Secondary | ICD-10-CM | POA: Diagnosis not present

## 2016-06-20 NOTE — Telephone Encounter (Signed)
Tried to call again and no answer

## 2016-06-25 DIAGNOSIS — I10 Essential (primary) hypertension: Secondary | ICD-10-CM | POA: Diagnosis not present

## 2016-06-25 DIAGNOSIS — M79606 Pain in leg, unspecified: Secondary | ICD-10-CM | POA: Diagnosis not present

## 2016-06-25 DIAGNOSIS — K649 Unspecified hemorrhoids: Secondary | ICD-10-CM | POA: Diagnosis not present

## 2016-06-25 DIAGNOSIS — M47817 Spondylosis without myelopathy or radiculopathy, lumbosacral region: Secondary | ICD-10-CM | POA: Diagnosis not present

## 2016-06-25 DIAGNOSIS — G894 Chronic pain syndrome: Secondary | ICD-10-CM | POA: Diagnosis not present

## 2016-06-25 DIAGNOSIS — E875 Hyperkalemia: Secondary | ICD-10-CM | POA: Diagnosis not present

## 2016-06-25 DIAGNOSIS — M5137 Other intervertebral disc degeneration, lumbosacral region: Secondary | ICD-10-CM | POA: Diagnosis not present

## 2016-06-25 DIAGNOSIS — E7800 Pure hypercholesterolemia, unspecified: Secondary | ICD-10-CM | POA: Diagnosis not present

## 2016-06-25 DIAGNOSIS — Z79899 Other long term (current) drug therapy: Secondary | ICD-10-CM | POA: Diagnosis not present

## 2016-06-25 DIAGNOSIS — M5416 Radiculopathy, lumbar region: Secondary | ICD-10-CM | POA: Diagnosis not present

## 2016-06-25 DIAGNOSIS — Z79891 Long term (current) use of opiate analgesic: Secondary | ICD-10-CM | POA: Diagnosis not present

## 2016-06-28 ENCOUNTER — Ambulatory Visit (INDEPENDENT_AMBULATORY_CARE_PROVIDER_SITE_OTHER): Payer: Medicare Other | Admitting: Specialist

## 2016-07-09 DIAGNOSIS — A09 Infectious gastroenteritis and colitis, unspecified: Secondary | ICD-10-CM | POA: Diagnosis not present

## 2016-07-12 NOTE — Telephone Encounter (Signed)
Can you call patient and let her know that we have been trying to call her to let her know that her CD has been made and is ready for pick up

## 2016-07-12 NOTE — Telephone Encounter (Signed)
Tried calling patient, no answer on cell or home phone.

## 2016-07-13 NOTE — Telephone Encounter (Signed)
Called patient this morning. Patient stated someone did speak with her yesterday and she is aware her CD is ready and she will pick it up when she can.

## 2016-07-19 DIAGNOSIS — J209 Acute bronchitis, unspecified: Secondary | ICD-10-CM | POA: Diagnosis not present

## 2016-08-02 DIAGNOSIS — G894 Chronic pain syndrome: Secondary | ICD-10-CM | POA: Diagnosis not present

## 2016-08-02 DIAGNOSIS — Z79891 Long term (current) use of opiate analgesic: Secondary | ICD-10-CM | POA: Diagnosis not present

## 2016-08-02 DIAGNOSIS — M5137 Other intervertebral disc degeneration, lumbosacral region: Secondary | ICD-10-CM | POA: Diagnosis not present

## 2016-08-02 DIAGNOSIS — Z79899 Other long term (current) drug therapy: Secondary | ICD-10-CM | POA: Diagnosis not present

## 2016-08-02 DIAGNOSIS — M545 Low back pain: Secondary | ICD-10-CM | POA: Diagnosis not present

## 2016-08-02 DIAGNOSIS — M47817 Spondylosis without myelopathy or radiculopathy, lumbosacral region: Secondary | ICD-10-CM | POA: Diagnosis not present

## 2016-08-06 ENCOUNTER — Encounter (INDEPENDENT_AMBULATORY_CARE_PROVIDER_SITE_OTHER): Payer: Self-pay | Admitting: Specialist

## 2016-08-06 ENCOUNTER — Ambulatory Visit (INDEPENDENT_AMBULATORY_CARE_PROVIDER_SITE_OTHER): Payer: Medicare Other | Admitting: Specialist

## 2016-08-06 DIAGNOSIS — M48062 Spinal stenosis, lumbar region with neurogenic claudication: Secondary | ICD-10-CM
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
## 2016-08-06 NOTE — Patient Instructions (Addendum)
Avoid bending, stooping and avoid lifting weights greater than 10 lbs. Avoid prolong standing and walking. Avoid frequent bending and stooping  No lifting greater than 10 lbs. May use ice or moist heat for pain. Weight loss is of benefit. Handicap license is approved. Dr. Andree Elk secretary/Assistant will call to arrange for epidural steroid injection

## 2016-08-07 DIAGNOSIS — I10 Essential (primary) hypertension: Secondary | ICD-10-CM | POA: Diagnosis not present

## 2016-08-07 DIAGNOSIS — F418 Other specified anxiety disorders: Secondary | ICD-10-CM | POA: Diagnosis not present

## 2016-09-03 DIAGNOSIS — Z79891 Long term (current) use of opiate analgesic: Secondary | ICD-10-CM | POA: Diagnosis not present

## 2016-09-03 DIAGNOSIS — M47817 Spondylosis without myelopathy or radiculopathy, lumbosacral region: Secondary | ICD-10-CM | POA: Diagnosis not present

## 2016-09-03 DIAGNOSIS — M5137 Other intervertebral disc degeneration, lumbosacral region: Secondary | ICD-10-CM | POA: Diagnosis not present

## 2016-09-03 DIAGNOSIS — G894 Chronic pain syndrome: Secondary | ICD-10-CM | POA: Diagnosis not present

## 2016-09-03 DIAGNOSIS — M79606 Pain in leg, unspecified: Secondary | ICD-10-CM | POA: Diagnosis not present

## 2016-09-03 DIAGNOSIS — Z79899 Other long term (current) drug therapy: Secondary | ICD-10-CM | POA: Diagnosis not present

## 2016-09-06 DIAGNOSIS — I10 Essential (primary) hypertension: Secondary | ICD-10-CM | POA: Diagnosis not present

## 2016-09-13 DIAGNOSIS — A09 Infectious gastroenteritis and colitis, unspecified: Secondary | ICD-10-CM | POA: Diagnosis not present

## 2016-09-18 DIAGNOSIS — M545 Low back pain: Secondary | ICD-10-CM | POA: Diagnosis not present

## 2016-09-18 DIAGNOSIS — M5137 Other intervertebral disc degeneration, lumbosacral region: Secondary | ICD-10-CM | POA: Diagnosis not present

## 2016-09-18 DIAGNOSIS — M47817 Spondylosis without myelopathy or radiculopathy, lumbosacral region: Secondary | ICD-10-CM | POA: Diagnosis not present

## 2016-10-04 DIAGNOSIS — M79606 Pain in leg, unspecified: Secondary | ICD-10-CM | POA: Diagnosis not present

## 2016-10-04 DIAGNOSIS — G894 Chronic pain syndrome: Secondary | ICD-10-CM | POA: Diagnosis not present

## 2016-10-04 DIAGNOSIS — M47817 Spondylosis without myelopathy or radiculopathy, lumbosacral region: Secondary | ICD-10-CM | POA: Diagnosis not present

## 2016-10-04 DIAGNOSIS — M5137 Other intervertebral disc degeneration, lumbosacral region: Secondary | ICD-10-CM | POA: Diagnosis not present

## 2016-10-04 DIAGNOSIS — Z79891 Long term (current) use of opiate analgesic: Secondary | ICD-10-CM | POA: Diagnosis not present

## 2016-10-04 DIAGNOSIS — Z79899 Other long term (current) drug therapy: Secondary | ICD-10-CM | POA: Diagnosis not present

## 2016-10-29 DIAGNOSIS — M47817 Spondylosis without myelopathy or radiculopathy, lumbosacral region: Secondary | ICD-10-CM | POA: Diagnosis not present

## 2016-10-29 DIAGNOSIS — G894 Chronic pain syndrome: Secondary | ICD-10-CM | POA: Diagnosis not present

## 2016-10-29 DIAGNOSIS — Z79899 Other long term (current) drug therapy: Secondary | ICD-10-CM | POA: Diagnosis not present

## 2016-10-29 DIAGNOSIS — Z79891 Long term (current) use of opiate analgesic: Secondary | ICD-10-CM | POA: Diagnosis not present

## 2016-10-29 DIAGNOSIS — M5137 Other intervertebral disc degeneration, lumbosacral region: Secondary | ICD-10-CM | POA: Diagnosis not present

## 2016-10-29 DIAGNOSIS — M79606 Pain in leg, unspecified: Secondary | ICD-10-CM | POA: Diagnosis not present

## 2016-11-06 DIAGNOSIS — T148XXA Other injury of unspecified body region, initial encounter: Secondary | ICD-10-CM | POA: Diagnosis not present

## 2016-11-14 ENCOUNTER — Ambulatory Visit (INDEPENDENT_AMBULATORY_CARE_PROVIDER_SITE_OTHER): Payer: Medicare Other

## 2016-11-14 ENCOUNTER — Ambulatory Visit (INDEPENDENT_AMBULATORY_CARE_PROVIDER_SITE_OTHER): Payer: Medicare Other | Admitting: Specialist

## 2016-11-14 ENCOUNTER — Encounter (INDEPENDENT_AMBULATORY_CARE_PROVIDER_SITE_OTHER): Payer: Self-pay | Admitting: Specialist

## 2016-11-14 VITALS — BP 143/94 | HR 82 | Ht 62.0 in | Wt 129.0 lb

## 2016-11-14 DIAGNOSIS — M48062 Spinal stenosis, lumbar region with neurogenic claudication: Secondary | ICD-10-CM | POA: Diagnosis not present

## 2016-11-14 DIAGNOSIS — M4316 Spondylolisthesis, lumbar region: Secondary | ICD-10-CM | POA: Diagnosis not present

## 2016-11-14 DIAGNOSIS — M5137 Other intervertebral disc degeneration, lumbosacral region: Secondary | ICD-10-CM

## 2016-11-14 MED ORDER — DICLOFENAC POTASSIUM 50 MG PO TABS: 50.0000 mg | ORAL_TABLET | Freq: Three times a day (TID) | ORAL | 3 refills | Status: DC

## 2016-11-14 NOTE — Patient Instructions (Addendum)
Avoid bending, stooping and avoid lifting weights greater than 10 lbs. Avoid prolong standing and walking. Avoid frequent bending and stooping  No lifting greater than 10 lbs. May use ice or moist heat for pain. Weight loss is of benefit. Handicap license is approved. If surgery were considered a lumbar decompressive and fusion would be indicated.
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
## 2016-11-28 ENCOUNTER — Telehealth (INDEPENDENT_AMBULATORY_CARE_PROVIDER_SITE_OTHER): Payer: Self-pay | Admitting: Specialist

## 2016-11-28 NOTE — Telephone Encounter (Signed)
FAXED 11/14/2016 OV NOTE TO [facility] PER AUTHORIZATION

## 2016-12-07 DIAGNOSIS — L84 Corns and callosities: Secondary | ICD-10-CM | POA: Diagnosis not present

## 2016-12-07 DIAGNOSIS — M47817 Spondylosis without myelopathy or radiculopathy, lumbosacral region: Secondary | ICD-10-CM | POA: Diagnosis not present

## 2016-12-07 DIAGNOSIS — M5137 Other intervertebral disc degeneration, lumbosacral region: Secondary | ICD-10-CM | POA: Diagnosis not present

## 2016-12-07 DIAGNOSIS — M545 Low back pain: Secondary | ICD-10-CM | POA: Diagnosis not present

## 2016-12-07 DIAGNOSIS — M79675 Pain in left toe(s): Secondary | ICD-10-CM | POA: Diagnosis not present

## 2016-12-07 DIAGNOSIS — Z79891 Long term (current) use of opiate analgesic: Secondary | ICD-10-CM | POA: Diagnosis not present

## 2016-12-07 DIAGNOSIS — G894 Chronic pain syndrome: Secondary | ICD-10-CM | POA: Diagnosis not present

## 2016-12-07 DIAGNOSIS — Z79899 Other long term (current) drug therapy: Secondary | ICD-10-CM | POA: Diagnosis not present

## 2016-12-18 ENCOUNTER — Encounter: Payer: Self-pay | Admitting: Sports Medicine

## 2016-12-18 ENCOUNTER — Ambulatory Visit (INDEPENDENT_AMBULATORY_CARE_PROVIDER_SITE_OTHER): Payer: Medicare Other | Admitting: Sports Medicine

## 2016-12-18 VITALS — BP 143/96 | HR 67 | Resp 16 | Ht 62.5 in | Wt 128.0 lb

## 2016-12-18 DIAGNOSIS — L603 Nail dystrophy: Secondary | ICD-10-CM | POA: Diagnosis not present

## 2016-12-18 DIAGNOSIS — M79672 Pain in left foot: Secondary | ICD-10-CM | POA: Diagnosis not present

## 2016-12-18 DIAGNOSIS — Q828 Other specified congenital malformations of skin: Secondary | ICD-10-CM | POA: Diagnosis not present

## 2016-12-18 DIAGNOSIS — B351 Tinea unguium: Secondary | ICD-10-CM

## 2016-12-18 DIAGNOSIS — L609 Nail disorder, unspecified: Secondary | ICD-10-CM | POA: Diagnosis not present

## 2016-12-18 NOTE — Progress Notes (Signed)
   Subjective:    Patient ID: Martha Higgins, female    DOB: [birth year], 71 y.o.   MRN: [id number]  HPI    Review of Systems  Constitutional: Positive for appetite change, chills, fatigue and unexpected weight change.  HENT: Positive for sinus pain.   Gastrointestinal: Positive for abdominal distention, abdominal pain and diarrhea.  Genitourinary: Positive for frequency.  Musculoskeletal: Positive for arthralgias and back pain.  Neurological: Positive for numbness and headaches.  Hematological: Bruises/bleeds easily.  All other systems reviewed and are negative.      Objective:   Physical Exam        Assessment & Plan:
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
## 2016-12-20 DIAGNOSIS — A09 Infectious gastroenteritis and colitis, unspecified: Secondary | ICD-10-CM | POA: Diagnosis not present

## 2017-01-04 DIAGNOSIS — G894 Chronic pain syndrome: Secondary | ICD-10-CM | POA: Diagnosis not present

## 2017-01-04 DIAGNOSIS — M47817 Spondylosis without myelopathy or radiculopathy, lumbosacral region: Secondary | ICD-10-CM | POA: Diagnosis not present

## 2017-01-04 DIAGNOSIS — M79606 Pain in leg, unspecified: Secondary | ICD-10-CM | POA: Diagnosis not present

## 2017-01-04 DIAGNOSIS — M5137 Other intervertebral disc degeneration, lumbosacral region: Secondary | ICD-10-CM | POA: Diagnosis not present

## 2017-01-15 ENCOUNTER — Ambulatory Visit (INDEPENDENT_AMBULATORY_CARE_PROVIDER_SITE_OTHER): Payer: Medicare Other | Admitting: Sports Medicine

## 2017-01-15 ENCOUNTER — Encounter: Payer: Self-pay | Admitting: Sports Medicine

## 2017-01-15 DIAGNOSIS — L603 Nail dystrophy: Secondary | ICD-10-CM

## 2017-01-15 DIAGNOSIS — M79672 Pain in left foot: Secondary | ICD-10-CM | POA: Diagnosis not present

## 2017-01-15 DIAGNOSIS — Q828 Other specified congenital malformations of skin: Secondary | ICD-10-CM | POA: Diagnosis not present
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
## 2017-01-21 DIAGNOSIS — G894 Chronic pain syndrome: Secondary | ICD-10-CM | POA: Diagnosis not present

## 2017-01-21 DIAGNOSIS — M47817 Spondylosis without myelopathy or radiculopathy, lumbosacral region: Secondary | ICD-10-CM | POA: Diagnosis not present

## 2017-01-21 DIAGNOSIS — M5136 Other intervertebral disc degeneration, lumbar region: Secondary | ICD-10-CM | POA: Diagnosis not present

## 2017-02-04 DIAGNOSIS — M79606 Pain in leg, unspecified: Secondary | ICD-10-CM | POA: Diagnosis not present

## 2017-02-04 DIAGNOSIS — M47817 Spondylosis without myelopathy or radiculopathy, lumbosacral region: Secondary | ICD-10-CM | POA: Diagnosis not present

## 2017-02-04 DIAGNOSIS — G894 Chronic pain syndrome: Secondary | ICD-10-CM | POA: Diagnosis not present

## 2017-02-04 DIAGNOSIS — M5137 Other intervertebral disc degeneration, lumbosacral region: Secondary | ICD-10-CM | POA: Diagnosis not present

## 2017-02-13 ENCOUNTER — Ambulatory Visit (INDEPENDENT_AMBULATORY_CARE_PROVIDER_SITE_OTHER): Payer: Medicare Other | Admitting: Specialist

## 2017-02-13 ENCOUNTER — Encounter (INDEPENDENT_AMBULATORY_CARE_PROVIDER_SITE_OTHER): Payer: Self-pay | Admitting: Specialist

## 2017-02-13 VITALS — BP 137/82 | HR 58 | Ht 62.0 in | Wt 129.0 lb

## 2017-02-13 DIAGNOSIS — Z5321 Procedure and treatment not carried out due to patient leaving prior to being seen by health care provider: Secondary | ICD-10-CM

## 2017-02-15 DIAGNOSIS — M545 Low back pain: Secondary | ICD-10-CM | POA: Diagnosis not present

## 2017-02-25 DIAGNOSIS — M79672 Pain in left foot: Secondary | ICD-10-CM | POA: Diagnosis not present

## 2017-02-25 DIAGNOSIS — M25571 Pain in right ankle and joints of right foot: Secondary | ICD-10-CM | POA: Diagnosis not present

## 2017-02-25 DIAGNOSIS — S99911A Unspecified injury of right ankle, initial encounter: Secondary | ICD-10-CM | POA: Diagnosis not present

## 2017-02-25 DIAGNOSIS — M47817 Spondylosis without myelopathy or radiculopathy, lumbosacral region: Secondary | ICD-10-CM | POA: Diagnosis not present

## 2017-02-25 DIAGNOSIS — S99922A Unspecified injury of left foot, initial encounter: Secondary | ICD-10-CM | POA: Diagnosis not present

## 2017-02-25 DIAGNOSIS — W19XXXA Unspecified fall, initial encounter: Secondary | ICD-10-CM | POA: Diagnosis not present

## 2017-02-26 ENCOUNTER — Ambulatory Visit (INDEPENDENT_AMBULATORY_CARE_PROVIDER_SITE_OTHER): Payer: Medicare Other | Admitting: Orthopedic Surgery

## 2017-02-26 ENCOUNTER — Encounter (INDEPENDENT_AMBULATORY_CARE_PROVIDER_SITE_OTHER): Payer: Self-pay | Admitting: Orthopedic Surgery

## 2017-02-26 VITALS — Ht 62.0 in | Wt 129.0 lb

## 2017-02-26 DIAGNOSIS — M84375A Stress fracture, left foot, initial encounter for fracture: Secondary | ICD-10-CM | POA: Diagnosis not present
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
## 2017-03-05 DIAGNOSIS — M5137 Other intervertebral disc degeneration, lumbosacral region: Secondary | ICD-10-CM | POA: Diagnosis not present

## 2017-03-05 DIAGNOSIS — M545 Low back pain: Secondary | ICD-10-CM | POA: Diagnosis not present

## 2017-03-05 DIAGNOSIS — M47817 Spondylosis without myelopathy or radiculopathy, lumbosacral region: Secondary | ICD-10-CM | POA: Diagnosis not present

## 2017-03-05 DIAGNOSIS — Z79899 Other long term (current) drug therapy: Secondary | ICD-10-CM | POA: Diagnosis not present

## 2017-03-05 DIAGNOSIS — Z79891 Long term (current) use of opiate analgesic: Secondary | ICD-10-CM | POA: Diagnosis not present

## 2017-03-05 DIAGNOSIS — G894 Chronic pain syndrome: Secondary | ICD-10-CM | POA: Diagnosis not present

## 2017-03-21 ENCOUNTER — Encounter (INDEPENDENT_AMBULATORY_CARE_PROVIDER_SITE_OTHER): Payer: Self-pay | Admitting: Family

## 2017-03-21 ENCOUNTER — Ambulatory Visit (INDEPENDENT_AMBULATORY_CARE_PROVIDER_SITE_OTHER): Payer: Medicare Other

## 2017-03-21 ENCOUNTER — Ambulatory Visit (INDEPENDENT_AMBULATORY_CARE_PROVIDER_SITE_OTHER): Payer: Medicare Other | Admitting: Family

## 2017-03-21 VITALS — Ht 62.0 in | Wt 129.0 lb

## 2017-03-21 DIAGNOSIS — M84375D Stress fracture, left foot, subsequent encounter for fracture with routine healing: Secondary | ICD-10-CM

## 2017-03-21 DIAGNOSIS — M79672 Pain in left foot: Secondary | ICD-10-CM | POA: Diagnosis not present
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
## 2017-03-26 ENCOUNTER — Ambulatory Visit (INDEPENDENT_AMBULATORY_CARE_PROVIDER_SITE_OTHER): Payer: Medicare Other | Admitting: Orthopedic Surgery

## 2017-04-02 DIAGNOSIS — M79606 Pain in leg, unspecified: Secondary | ICD-10-CM | POA: Diagnosis not present

## 2017-04-02 DIAGNOSIS — Z79899 Other long term (current) drug therapy: Secondary | ICD-10-CM | POA: Diagnosis not present

## 2017-04-02 DIAGNOSIS — M47817 Spondylosis without myelopathy or radiculopathy, lumbosacral region: Secondary | ICD-10-CM | POA: Diagnosis not present

## 2017-04-02 DIAGNOSIS — M5137 Other intervertebral disc degeneration, lumbosacral region: Secondary | ICD-10-CM | POA: Diagnosis not present

## 2017-04-02 DIAGNOSIS — Z79891 Long term (current) use of opiate analgesic: Secondary | ICD-10-CM | POA: Diagnosis not present

## 2017-04-02 DIAGNOSIS — G894 Chronic pain syndrome: Secondary | ICD-10-CM | POA: Diagnosis not present

## 2017-04-03 ENCOUNTER — Telehealth (INDEPENDENT_AMBULATORY_CARE_PROVIDER_SITE_OTHER): Payer: Self-pay | Admitting: Radiology

## 2017-04-03 NOTE — Telephone Encounter (Signed)
Patient is calling triage this morning. She thought her appointment was today with Dr. Louanne Skye when in fact it is tomorrow at 2:15pm. His schedule already appeared full this morning. She has history of steroid injections at [facility]. She states she actually saw them yesterday for just her monthly check up. Later into the evening she is experiencing acute severe radicular pain down her right leg to her toes that brings her to tears. She is wanting to know what can be done for her. Her last injection was in February. She is wanting some relief, and is trying to avoid another sleepless, painful night. Please call her to advise [phone].

## 2017-04-03 NOTE — Telephone Encounter (Signed)
Patient is calling triage this morning. She thought her appointment was today with Dr. Louanne Skye when in fact it is tomorrow at 2:15pm. His schedule already appeared full this morning. She has history of steroid injections at [facility]. She states she actually saw them yesterday for just her monthly check up. Later into the evening she is experiencing acute severe radicular pain down her right leg to her toes that brings her to tears. She is wanting to know what can be done for her. Her last injection was in February. She is wanting some relief, and is trying to avoid another sleepless, painful night. Please call her to advise [phone].

## 2017-04-04 ENCOUNTER — Ambulatory Visit (INDEPENDENT_AMBULATORY_CARE_PROVIDER_SITE_OTHER): Payer: Medicare Other | Admitting: Specialist

## 2017-04-04 ENCOUNTER — Encounter (INDEPENDENT_AMBULATORY_CARE_PROVIDER_SITE_OTHER): Payer: Self-pay | Admitting: Specialist

## 2017-04-04 VITALS — BP 128/74 | HR 58 | Ht 62.5 in | Wt 129.0 lb

## 2017-04-04 DIAGNOSIS — M48062 Spinal stenosis, lumbar region with neurogenic claudication: Secondary | ICD-10-CM

## 2017-04-04 DIAGNOSIS — M4316 Spondylolisthesis, lumbar region: Secondary | ICD-10-CM | POA: Diagnosis not present

## 2017-04-04 DIAGNOSIS — M5136 Other intervertebral disc degeneration, lumbar region: Secondary | ICD-10-CM | POA: Diagnosis not present

## 2017-04-04 NOTE — Patient Instructions (Signed)
Plan: Avoid bending, stooping and avoid lifting weights greater than 10 lbs. Avoid prolong standing and walking. Avoid frequent bending and stooping  No lifting greater than 10 lbs. May use ice or moist heat for pain. Weight loss is of benefit. Handicap license is approved. 
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
## 2017-04-08 DIAGNOSIS — M5136 Other intervertebral disc degeneration, lumbar region: Secondary | ICD-10-CM | POA: Diagnosis not present

## 2017-04-08 DIAGNOSIS — M47817 Spondylosis without myelopathy or radiculopathy, lumbosacral region: Secondary | ICD-10-CM | POA: Diagnosis not present

## 2017-05-02 DIAGNOSIS — G894 Chronic pain syndrome: Secondary | ICD-10-CM | POA: Diagnosis not present

## 2017-05-02 DIAGNOSIS — M79606 Pain in leg, unspecified: Secondary | ICD-10-CM | POA: Diagnosis not present

## 2017-05-02 DIAGNOSIS — M47817 Spondylosis without myelopathy or radiculopathy, lumbosacral region: Secondary | ICD-10-CM | POA: Diagnosis not present

## 2017-05-02 DIAGNOSIS — M5137 Other intervertebral disc degeneration, lumbosacral region: Secondary | ICD-10-CM | POA: Diagnosis not present

## 2017-05-13 DIAGNOSIS — M47817 Spondylosis without myelopathy or radiculopathy, lumbosacral region: Secondary | ICD-10-CM | POA: Diagnosis not present

## 2017-05-13 DIAGNOSIS — M5136 Other intervertebral disc degeneration, lumbar region: Secondary | ICD-10-CM | POA: Diagnosis not present

## 2017-05-13 DIAGNOSIS — Z23 Encounter for immunization: Secondary | ICD-10-CM | POA: Diagnosis not present

## 2017-05-30 DIAGNOSIS — M5137 Other intervertebral disc degeneration, lumbosacral region: Secondary | ICD-10-CM | POA: Diagnosis not present

## 2017-05-30 DIAGNOSIS — G894 Chronic pain syndrome: Secondary | ICD-10-CM | POA: Diagnosis not present

## 2017-05-30 DIAGNOSIS — Z79891 Long term (current) use of opiate analgesic: Secondary | ICD-10-CM | POA: Diagnosis not present

## 2017-05-30 DIAGNOSIS — M47817 Spondylosis without myelopathy or radiculopathy, lumbosacral region: Secondary | ICD-10-CM | POA: Diagnosis not present

## 2017-05-30 DIAGNOSIS — Z79899 Other long term (current) drug therapy: Secondary | ICD-10-CM | POA: Diagnosis not present

## 2017-05-30 DIAGNOSIS — M79606 Pain in leg, unspecified: Secondary | ICD-10-CM | POA: Diagnosis not present

## 2017-06-17 DIAGNOSIS — M5136 Other intervertebral disc degeneration, lumbar region: Secondary | ICD-10-CM | POA: Diagnosis not present

## 2017-06-17 DIAGNOSIS — M47817 Spondylosis without myelopathy or radiculopathy, lumbosacral region: Secondary | ICD-10-CM | POA: Diagnosis not present

## 2017-07-01 DIAGNOSIS — E7800 Pure hypercholesterolemia, unspecified: Secondary | ICD-10-CM | POA: Diagnosis not present

## 2017-07-01 DIAGNOSIS — I10 Essential (primary) hypertension: Secondary | ICD-10-CM | POA: Diagnosis not present

## 2017-07-01 DIAGNOSIS — Z0000 Encounter for general adult medical examination without abnormal findings: Secondary | ICD-10-CM | POA: Diagnosis not present

## 2017-07-01 DIAGNOSIS — E559 Vitamin D deficiency, unspecified: Secondary | ICD-10-CM | POA: Diagnosis not present

## 2017-07-02 DIAGNOSIS — Z79891 Long term (current) use of opiate analgesic: Secondary | ICD-10-CM | POA: Diagnosis not present

## 2017-07-02 DIAGNOSIS — Z79899 Other long term (current) drug therapy: Secondary | ICD-10-CM | POA: Diagnosis not present

## 2017-07-02 DIAGNOSIS — M47817 Spondylosis without myelopathy or radiculopathy, lumbosacral region: Secondary | ICD-10-CM | POA: Diagnosis not present

## 2017-07-02 DIAGNOSIS — G894 Chronic pain syndrome: Secondary | ICD-10-CM | POA: Diagnosis not present

## 2017-07-02 DIAGNOSIS — M79606 Pain in leg, unspecified: Secondary | ICD-10-CM | POA: Diagnosis not present

## 2017-07-02 DIAGNOSIS — M5137 Other intervertebral disc degeneration, lumbosacral region: Secondary | ICD-10-CM | POA: Diagnosis not present

## 2017-07-04 DIAGNOSIS — E7800 Pure hypercholesterolemia, unspecified: Secondary | ICD-10-CM | POA: Diagnosis not present

## 2017-07-04 DIAGNOSIS — Z22322 Carrier or suspected carrier of Methicillin resistant Staphylococcus aureus: Secondary | ICD-10-CM | POA: Diagnosis not present

## 2017-07-04 DIAGNOSIS — M539 Dorsopathy, unspecified: Secondary | ICD-10-CM | POA: Diagnosis not present

## 2017-07-04 DIAGNOSIS — G43909 Migraine, unspecified, not intractable, without status migrainosus: Secondary | ICD-10-CM | POA: Diagnosis not present

## 2017-07-04 DIAGNOSIS — I10 Essential (primary) hypertension: Secondary | ICD-10-CM | POA: Diagnosis not present

## 2017-07-04 DIAGNOSIS — F5101 Primary insomnia: Secondary | ICD-10-CM | POA: Diagnosis not present

## 2017-07-04 DIAGNOSIS — M5416 Radiculopathy, lumbar region: Secondary | ICD-10-CM | POA: Diagnosis not present

## 2017-07-04 DIAGNOSIS — R351 Nocturia: Secondary | ICD-10-CM | POA: Diagnosis not present

## 2017-07-04 DIAGNOSIS — G44209 Tension-type headache, unspecified, not intractable: Secondary | ICD-10-CM | POA: Diagnosis not present

## 2017-07-04 DIAGNOSIS — E559 Vitamin D deficiency, unspecified: Secondary | ICD-10-CM | POA: Diagnosis not present

## 2017-07-04 DIAGNOSIS — E875 Hyperkalemia: Secondary | ICD-10-CM | POA: Diagnosis not present

## 2017-07-04 DIAGNOSIS — M5441 Lumbago with sciatica, right side: Secondary | ICD-10-CM | POA: Diagnosis not present

## 2017-07-11 DIAGNOSIS — M5137 Other intervertebral disc degeneration, lumbosacral region: Secondary | ICD-10-CM | POA: Diagnosis not present

## 2017-07-11 DIAGNOSIS — Z79891 Long term (current) use of opiate analgesic: Secondary | ICD-10-CM | POA: Diagnosis not present

## 2017-07-11 DIAGNOSIS — M47817 Spondylosis without myelopathy or radiculopathy, lumbosacral region: Secondary | ICD-10-CM | POA: Diagnosis not present

## 2017-07-11 DIAGNOSIS — M79606 Pain in leg, unspecified: Secondary | ICD-10-CM | POA: Diagnosis not present

## 2017-07-11 DIAGNOSIS — G894 Chronic pain syndrome: Secondary | ICD-10-CM | POA: Diagnosis not present

## 2017-07-11 DIAGNOSIS — Z79899 Other long term (current) drug therapy: Secondary | ICD-10-CM | POA: Diagnosis not present

## 2017-07-24 DIAGNOSIS — M47817 Spondylosis without myelopathy or radiculopathy, lumbosacral region: Secondary | ICD-10-CM | POA: Diagnosis not present

## 2017-07-24 DIAGNOSIS — M5136 Other intervertebral disc degeneration, lumbar region: Secondary | ICD-10-CM | POA: Diagnosis not present

## 2017-07-24 DIAGNOSIS — G894 Chronic pain syndrome: Secondary | ICD-10-CM | POA: Diagnosis not present

## 2017-07-26 DIAGNOSIS — I493 Ventricular premature depolarization: Secondary | ICD-10-CM | POA: Diagnosis not present

## 2017-07-26 DIAGNOSIS — R079 Chest pain, unspecified: Secondary | ICD-10-CM | POA: Diagnosis not present

## 2017-07-26 DIAGNOSIS — I10 Essential (primary) hypertension: Secondary | ICD-10-CM | POA: Diagnosis not present

## 2017-08-01 DIAGNOSIS — I10 Essential (primary) hypertension: Secondary | ICD-10-CM | POA: Diagnosis not present

## 2017-08-01 DIAGNOSIS — R079 Chest pain, unspecified: Secondary | ICD-10-CM | POA: Diagnosis not present

## 2017-08-01 DIAGNOSIS — I493 Ventricular premature depolarization: Secondary | ICD-10-CM | POA: Diagnosis not present

## 2017-08-02 DIAGNOSIS — R0789 Other chest pain: Secondary | ICD-10-CM | POA: Diagnosis not present

## 2017-08-02 DIAGNOSIS — I493 Ventricular premature depolarization: Secondary | ICD-10-CM | POA: Diagnosis not present

## 2017-08-02 DIAGNOSIS — I10 Essential (primary) hypertension: Secondary | ICD-10-CM | POA: Diagnosis not present

## 2017-08-06 DIAGNOSIS — F339 Major depressive disorder, recurrent, unspecified: Secondary | ICD-10-CM | POA: Diagnosis not present

## 2017-08-06 DIAGNOSIS — R1013 Epigastric pain: Secondary | ICD-10-CM | POA: Diagnosis not present

## 2017-08-06 DIAGNOSIS — I10 Essential (primary) hypertension: Secondary | ICD-10-CM | POA: Diagnosis not present

## 2017-08-06 DIAGNOSIS — R350 Frequency of micturition: Secondary | ICD-10-CM | POA: Diagnosis not present

## 2017-08-12 DIAGNOSIS — I493 Ventricular premature depolarization: Secondary | ICD-10-CM | POA: Diagnosis not present

## 2017-08-12 DIAGNOSIS — I10 Essential (primary) hypertension: Secondary | ICD-10-CM | POA: Diagnosis not present

## 2017-08-12 DIAGNOSIS — E782 Mixed hyperlipidemia: Secondary | ICD-10-CM | POA: Diagnosis not present

## 2017-08-12 DIAGNOSIS — R079 Chest pain, unspecified: Secondary | ICD-10-CM | POA: Diagnosis not present

## 2017-08-15 DIAGNOSIS — R1013 Epigastric pain: Secondary | ICD-10-CM | POA: Diagnosis not present

## 2017-08-22 DIAGNOSIS — M5137 Other intervertebral disc degeneration, lumbosacral region: Secondary | ICD-10-CM | POA: Diagnosis not present

## 2017-08-22 DIAGNOSIS — M79606 Pain in leg, unspecified: Secondary | ICD-10-CM | POA: Diagnosis not present

## 2017-08-22 DIAGNOSIS — Z79899 Other long term (current) drug therapy: Secondary | ICD-10-CM | POA: Diagnosis not present

## 2017-08-22 DIAGNOSIS — M47817 Spondylosis without myelopathy or radiculopathy, lumbosacral region: Secondary | ICD-10-CM | POA: Diagnosis not present

## 2017-08-22 DIAGNOSIS — G894 Chronic pain syndrome: Secondary | ICD-10-CM | POA: Diagnosis not present

## 2017-08-22 DIAGNOSIS — Z79891 Long term (current) use of opiate analgesic: Secondary | ICD-10-CM | POA: Diagnosis not present

## 2017-08-23 ENCOUNTER — Encounter (INDEPENDENT_AMBULATORY_CARE_PROVIDER_SITE_OTHER): Payer: Self-pay | Admitting: Specialist

## 2017-08-23 NOTE — Progress Notes (Signed)
Patent was not seen,no major complaints,  no charge.

## 2017-08-28 DIAGNOSIS — M79604 Pain in right leg: Secondary | ICD-10-CM | POA: Diagnosis not present

## 2017-08-28 DIAGNOSIS — M545 Low back pain: Secondary | ICD-10-CM | POA: Diagnosis not present

## 2017-08-28 DIAGNOSIS — M47817 Spondylosis without myelopathy or radiculopathy, lumbosacral region: Secondary | ICD-10-CM | POA: Diagnosis not present

## 2017-08-28 DIAGNOSIS — M5136 Other intervertebral disc degeneration, lumbar region: Secondary | ICD-10-CM | POA: Diagnosis not present

## 2017-09-03 ENCOUNTER — Other Ambulatory Visit: Payer: Self-pay | Admitting: Pain Medicine

## 2017-09-03 DIAGNOSIS — M79604 Pain in right leg: Secondary | ICD-10-CM

## 2017-09-03 DIAGNOSIS — M545 Low back pain, unspecified: Secondary | ICD-10-CM

## 2017-09-04 DIAGNOSIS — E559 Vitamin D deficiency, unspecified: Secondary | ICD-10-CM | POA: Diagnosis not present

## 2017-09-05 DIAGNOSIS — E559 Vitamin D deficiency, unspecified: Secondary | ICD-10-CM | POA: Diagnosis not present

## 2017-09-05 DIAGNOSIS — F339 Major depressive disorder, recurrent, unspecified: Secondary | ICD-10-CM | POA: Diagnosis not present

## 2017-09-05 DIAGNOSIS — I10 Essential (primary) hypertension: Secondary | ICD-10-CM | POA: Diagnosis not present

## 2017-09-10 ENCOUNTER — Ambulatory Visit
Admission: RE | Admit: 2017-09-10 | Discharge: 2017-09-10 | Disposition: A | Discharge status: Home / Self Care | Payer: Medicare Other | Source: Ambulatory Visit | Attending: Pain Medicine | Admitting: Pain Medicine

## 2017-09-10 DIAGNOSIS — M545 Low back pain, unspecified: Secondary | ICD-10-CM

## 2017-09-10 DIAGNOSIS — M48061 Spinal stenosis, lumbar region without neurogenic claudication: Secondary | ICD-10-CM | POA: Diagnosis not present

## 2017-09-10 DIAGNOSIS — M79604 Pain in right leg: Secondary | ICD-10-CM

## 2017-10-02 DIAGNOSIS — M47817 Spondylosis without myelopathy or radiculopathy, lumbosacral region: Secondary | ICD-10-CM | POA: Diagnosis not present

## 2017-10-02 DIAGNOSIS — E559 Vitamin D deficiency, unspecified: Secondary | ICD-10-CM | POA: Diagnosis not present

## 2017-10-02 DIAGNOSIS — M5136 Other intervertebral disc degeneration, lumbar region: Secondary | ICD-10-CM | POA: Diagnosis not present

## 2017-10-08 DIAGNOSIS — M4316 Spondylolisthesis, lumbar region: Secondary | ICD-10-CM | POA: Diagnosis not present

## 2017-10-11 ENCOUNTER — Other Ambulatory Visit: Payer: Self-pay | Admitting: Neurological Surgery

## 2017-10-11 DIAGNOSIS — M4316 Spondylolisthesis, lumbar region: Secondary | ICD-10-CM

## 2017-10-14 ENCOUNTER — Ambulatory Visit
Admission: RE | Admit: 2017-10-14 | Discharge: 2017-10-14 | Disposition: A | Discharge status: Home / Self Care | Payer: Medicare Other | Source: Ambulatory Visit | Attending: Neurological Surgery | Admitting: Neurological Surgery

## 2017-10-14 DIAGNOSIS — M5126 Other intervertebral disc displacement, lumbar region: Secondary | ICD-10-CM | POA: Diagnosis not present

## 2017-10-14 DIAGNOSIS — M4316 Spondylolisthesis, lumbar region: Secondary | ICD-10-CM

## 2017-10-17 DIAGNOSIS — Z6825 Body mass index (BMI) 25.0-25.9, adult: Secondary | ICD-10-CM | POA: Diagnosis not present

## 2017-10-17 DIAGNOSIS — M4316 Spondylolisthesis, lumbar region: Secondary | ICD-10-CM | POA: Diagnosis not present

## 2017-10-29 DIAGNOSIS — Z79899 Other long term (current) drug therapy: Secondary | ICD-10-CM | POA: Diagnosis not present

## 2017-10-29 DIAGNOSIS — M47817 Spondylosis without myelopathy or radiculopathy, lumbosacral region: Secondary | ICD-10-CM | POA: Diagnosis not present

## 2017-10-29 DIAGNOSIS — G894 Chronic pain syndrome: Secondary | ICD-10-CM | POA: Diagnosis not present

## 2017-10-29 DIAGNOSIS — Z79891 Long term (current) use of opiate analgesic: Secondary | ICD-10-CM | POA: Diagnosis not present

## 2017-10-30 DIAGNOSIS — L821 Other seborrheic keratosis: Secondary | ICD-10-CM | POA: Diagnosis not present

## 2017-11-06 DIAGNOSIS — M545 Low back pain: Secondary | ICD-10-CM | POA: Diagnosis not present

## 2017-11-06 DIAGNOSIS — M47817 Spondylosis without myelopathy or radiculopathy, lumbosacral region: Secondary | ICD-10-CM | POA: Diagnosis not present

## 2017-11-06 DIAGNOSIS — M79604 Pain in right leg: Secondary | ICD-10-CM | POA: Diagnosis not present

## 2017-11-06 DIAGNOSIS — M5136 Other intervertebral disc degeneration, lumbar region: Secondary | ICD-10-CM | POA: Diagnosis not present

## 2017-11-28 DIAGNOSIS — G894 Chronic pain syndrome: Secondary | ICD-10-CM | POA: Diagnosis not present

## 2017-11-28 DIAGNOSIS — M47817 Spondylosis without myelopathy or radiculopathy, lumbosacral region: Secondary | ICD-10-CM | POA: Diagnosis not present

## 2017-11-28 DIAGNOSIS — M79606 Pain in leg, unspecified: Secondary | ICD-10-CM | POA: Diagnosis not present

## 2017-12-05 ENCOUNTER — Other Ambulatory Visit: Payer: Self-pay | Admitting: Neurological Surgery

## 2017-12-10 DIAGNOSIS — F418 Other specified anxiety disorders: Secondary | ICD-10-CM | POA: Diagnosis not present

## 2017-12-16 DIAGNOSIS — M4316 Spondylolisthesis, lumbar region: Secondary | ICD-10-CM | POA: Diagnosis not present

## 2017-12-18 DIAGNOSIS — M47817 Spondylosis without myelopathy or radiculopathy, lumbosacral region: Secondary | ICD-10-CM | POA: Diagnosis not present

## 2017-12-18 DIAGNOSIS — M5136 Other intervertebral disc degeneration, lumbar region: Secondary | ICD-10-CM | POA: Diagnosis not present

## 2017-12-25 DIAGNOSIS — F418 Other specified anxiety disorders: Secondary | ICD-10-CM | POA: Diagnosis not present

## 2018-01-06 NOTE — Pre-Procedure Instructions (Signed)
Martha Higgins  01/06/2018      [facility] - [place], [place] AT [facility] OF [place] [address] [place] [address] Phone: [phone] Fax: [phone]    Your procedure is scheduled on June 19  Report to [place] at [place].M.  Call this number if you have problems the morning of surgery:  [phone]   Remember:  NOTHING TO EAT OR DRINK AFTER MIDNIGHT    Take these medicines the morning of surgery with A SIP OF WATER  ALPRAZolam (XANAX) DULoxetine (CYMBALTA gabapentin (NEURONTIN)  oxyCODONE-acetaminophen (PERCOCET) sertraline (ZOLOFT)  EYE DROPS IF NEEDED  7 days prior to surgery STOP taking any Aspirin(unless otherwise instructed by your surgeon), Aleve, Naproxen, Ibuprofen, Motrin, Advil, Goody's, BC's, all herbal medications, fish oil, and all vitamins diclofenac (CATAFLAM)     Do not wear jewelry, make-up or nail polish.  Do not wear lotions, powders, or perfumes, or deodorant.  Do not shave 48 hours prior to surgery.   Do not bring valuables to the hospital.  [facility] is not responsible for any belongings or valuables.  Contacts, dentures or bridgework may not be worn into surgery.  Leave your suitcase in the car.  After surgery it may be brought to your room.  For patients admitted to the hospital, discharge time will be determined by your treatment team.  Patients discharged the day of surgery will not be allowed to drive home.    Special instructions:   [place]- Preparing For Surgery  Before surgery, you can play an important role. Because skin is not sterile, your skin needs to be as free of germs as possible. You can reduce the number of germs on your skin by washing with CHG (chlorahexidine gluconate) Soap before surgery.  CHG is an antiseptic cleaner which kills germs and bonds with the skin to continue killing germs even after washing.    Oral Hygiene is also  important to reduce your risk of infection.  Remember - BRUSH YOUR TEETH THE MORNING OF SURGERY WITH YOUR REGULAR TOOTHPASTE  Please do not use if you have an allergy to CHG or antibacterial soaps. If your skin becomes reddened/irritated stop using the CHG.  Do not shave (including legs and underarms) for at least 48 hours prior to first CHG shower. It is OK to shave your face.  Please follow these instructions carefully.   1. Shower the NIGHT BEFORE SURGERY and the MORNING OF SURGERY with CHG.   2. If you chose to wash your hair, wash your hair first as usual with your normal shampoo.  3. After you shampoo, rinse your hair and body thoroughly to remove the shampoo.  4. Use CHG as you would any other liquid soap. You can apply CHG directly to the skin and wash gently with a scrungie or a clean washcloth.   5. Apply the CHG Soap to your body ONLY FROM THE NECK DOWN.  Do not use on open wounds or open sores. Avoid contact with your eyes, ears, mouth and genitals (private parts). Wash Face and genitals (private parts)  with your normal soap.  6. Wash thoroughly, paying special attention to the area where your surgery will be performed.  7. Thoroughly rinse your body with warm water from the neck down.  8. DO NOT shower/wash with your normal soap after using and rinsing off the CHG Soap.  9. Pat yourself dry with a  CLEAN TOWEL.  10. Wear CLEAN PAJAMAS to bed the night before surgery, wear comfortable clothes the morning of surgery  11. Place CLEAN SHEETS on your bed the night of your first shower and DO NOT SLEEP WITH PETS.    Day of Surgery:  Do not apply any deodorants/lotions.  Please wear clean clothes to the hospital/surgery center.   Remember to brush your teeth WITH YOUR REGULAR TOOTHPASTE.    Please read over the following fact sheets that you were given.

## 2018-01-07 ENCOUNTER — Other Ambulatory Visit: Payer: Self-pay

## 2018-01-07 ENCOUNTER — Encounter (HOSPITAL_COMMUNITY): Payer: Self-pay

## 2018-01-07 ENCOUNTER — Encounter (HOSPITAL_COMMUNITY)
Admission: RE | Admit: 2018-01-07 | Discharge: 2018-01-07 | Disposition: A | Discharge status: Home / Self Care | Payer: Medicare Other | Source: Ambulatory Visit | Attending: Neurological Surgery | Admitting: Neurological Surgery

## 2018-01-07 ENCOUNTER — Ambulatory Visit (HOSPITAL_COMMUNITY)
Admission: RE | Admit: 2018-01-07 | Discharge: 2018-01-07 | Disposition: A | Discharge status: Home / Self Care | Payer: Medicare Other | Source: Ambulatory Visit | Attending: Neurological Surgery | Admitting: Neurological Surgery

## 2018-01-07 DIAGNOSIS — Z01812 Encounter for preprocedural laboratory examination: Secondary | ICD-10-CM | POA: Insufficient documentation

## 2018-01-07 DIAGNOSIS — M4310 Spondylolisthesis, site unspecified: Secondary | ICD-10-CM | POA: Diagnosis not present

## 2018-01-07 DIAGNOSIS — Z01810 Encounter for preprocedural cardiovascular examination: Secondary | ICD-10-CM | POA: Diagnosis not present

## 2018-01-07 DIAGNOSIS — R079 Chest pain, unspecified: Secondary | ICD-10-CM | POA: Diagnosis not present

## 2018-01-07 HISTORY — DX: Gastro-esophageal reflux disease without esophagitis: K21.9

## 2018-01-07 HISTORY — DX: Cardiac murmur, unspecified: R01.1

## 2018-01-07 HISTORY — DX: Essential (primary) hypertension: I10

## 2018-01-07 HISTORY — DX: Sleep apnea, unspecified: G47.30

## 2018-01-07 LAB — CBC WITH DIFFERENTIAL/PLATELET
Abs Immature Granulocytes: 0 10*3/uL (ref 0.0–0.1)
BASOS ABS: 0.1 10*3/uL (ref 0.0–0.1)
BASOS PCT: 1 %
EOS ABS: 0.3 10*3/uL (ref 0.0–0.7)
EOS PCT: 2 %
HCT: 36.7 % (ref 36.0–46.0)
Hemoglobin: 11.3 g/dL — ABNORMAL LOW (ref 12.0–15.0)
Immature Granulocytes: 0 %
LYMPHS PCT: 19 %
Lymphs Abs: 2.1 10*3/uL (ref 0.7–4.0)
MCH: 27.9 pg (ref 26.0–34.0)
MCHC: 30.8 g/dL (ref 30.0–36.0)
MCV: 90.6 fL (ref 78.0–100.0)
Monocytes Absolute: 0.7 10*3/uL (ref 0.1–1.0)
Monocytes Relative: 7 %
Neutro Abs: 7.7 10*3/uL (ref 1.7–7.7)
Neutrophils Relative %: 71 %
PLATELETS: 254 10*3/uL (ref 150–400)
RBC: 4.05 MIL/uL (ref 3.87–5.11)
RDW: 13.4 % (ref 11.5–15.5)
WBC: 10.9 10*3/uL — AB (ref 4.0–10.5)

## 2018-01-07 LAB — BASIC METABOLIC PANEL
Anion gap: 7 (ref 5–15)
BUN: 14 mg/dL (ref 6–20)
CO2: 28 mmol/L (ref 22–32)
CREATININE: 1.37 mg/dL — AB (ref 0.44–1.00)
Calcium: 9.9 mg/dL (ref 8.9–10.3)
Chloride: 110 mmol/L (ref 101–111)
GFR calc Af Amer: 43 mL/min — ABNORMAL LOW (ref >60)
GFR, EST NON AFRICAN AMERICAN: 38 mL/min — AB (ref >60)
Glucose, Bld: 101 mg/dL — ABNORMAL HIGH (ref 65–99)
POTASSIUM: 4.1 mmol/L (ref 3.5–5.1)
SODIUM: 145 mmol/L (ref 135–145)

## 2018-01-07 LAB — PROTIME-INR
INR: 1
PROTHROMBIN TIME: 13.1 s (ref 11.4–15.2)

## 2018-01-07 LAB — TYPE AND SCREEN
ABO/RH(D): O NEG
Antibody Screen: NEGATIVE

## 2018-01-07 LAB — ABO/RH: ABO/RH(D): O NEG

## 2018-01-07 LAB — SURGICAL PCR SCREEN
MRSA, PCR: NEGATIVE
STAPHYLOCOCCUS AUREUS: POSITIVE — AB

## 2018-01-07 NOTE — Progress Notes (Signed)
PCP - [name] Cardiologist - [place]  Chest x-ray - 01/07/18 EKG - requesting Stress Test - requesting ECHO - requesting Cardiac Cath - denies  Sleep Study - > 20 years ago patient stated that she was borderline    Anesthesia review: yes for requested cardiac records  Patient denies shortness of breath, fever, cough and chest pain at PAT appointment   Patient verbalized understanding of instructions that were given to them at the PAT appointment. Patient was also instructed that they will need to review over the PAT instructions again at home before surgery.

## 2018-01-08 ENCOUNTER — Encounter (HOSPITAL_COMMUNITY): Payer: Self-pay
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
## 2018-01-09 DIAGNOSIS — W19XXXA Unspecified fall, initial encounter: Secondary | ICD-10-CM | POA: Diagnosis not present

## 2018-01-09 DIAGNOSIS — F418 Other specified anxiety disorders: Secondary | ICD-10-CM | POA: Diagnosis not present

## 2018-01-09 DIAGNOSIS — R0781 Pleurodynia: Secondary | ICD-10-CM | POA: Diagnosis not present

## 2018-01-09 DIAGNOSIS — S299XXA Unspecified injury of thorax, initial encounter: Secondary | ICD-10-CM | POA: Diagnosis not present

## 2018-01-15 ENCOUNTER — Inpatient Hospital Stay (HOSPITAL_COMMUNITY)
Admission: RE | Disposition: A | Discharge status: Home / Self Care | Payer: Self-pay | Source: Ambulatory Visit | Attending: Neurological Surgery

## 2018-01-15 ENCOUNTER — Inpatient Hospital Stay (HOSPITAL_COMMUNITY)
Admission: RE | Admit: 2018-01-15 | Discharge: 2018-01-16 | DRG: 460 | Disposition: A | Discharge status: Home / Self Care | Payer: Medicare Other | Source: Ambulatory Visit | Attending: Neurological Surgery | Admitting: Neurological Surgery

## 2018-01-15 ENCOUNTER — Encounter (HOSPITAL_COMMUNITY): Payer: Self-pay | Admitting: Neurological Surgery

## 2018-01-15 ENCOUNTER — Inpatient Hospital Stay (HOSPITAL_COMMUNITY): Payer: Medicare Other | Admitting: Emergency Medicine

## 2018-01-15 ENCOUNTER — Inpatient Hospital Stay (HOSPITAL_COMMUNITY): Payer: Medicare Other

## 2018-01-15 ENCOUNTER — Inpatient Hospital Stay (HOSPITAL_COMMUNITY): Payer: Medicare Other | Admitting: Certified Registered Nurse Anesthetist

## 2018-01-15 DIAGNOSIS — M4317 Spondylolisthesis, lumbosacral region: Principal | ICD-10-CM | POA: Diagnosis present

## 2018-01-15 DIAGNOSIS — Z981 Arthrodesis status: Secondary | ICD-10-CM

## 2018-01-15 DIAGNOSIS — G4730 Sleep apnea, unspecified: Secondary | ICD-10-CM | POA: Diagnosis present

## 2018-01-15 DIAGNOSIS — K219 Gastro-esophageal reflux disease without esophagitis: Secondary | ICD-10-CM | POA: Diagnosis present

## 2018-01-15 DIAGNOSIS — M48061 Spinal stenosis, lumbar region without neurogenic claudication: Secondary | ICD-10-CM | POA: Diagnosis present

## 2018-01-15 DIAGNOSIS — M4807 Spinal stenosis, lumbosacral region: Secondary | ICD-10-CM | POA: Diagnosis present

## 2018-01-15 DIAGNOSIS — M4326 Fusion of spine, lumbar region: Secondary | ICD-10-CM | POA: Diagnosis not present

## 2018-01-15 DIAGNOSIS — M549 Dorsalgia, unspecified: Secondary | ICD-10-CM | POA: Diagnosis not present

## 2018-01-15 DIAGNOSIS — M4316 Spondylolisthesis, lumbar region: Secondary | ICD-10-CM | POA: Diagnosis present

## 2018-01-15 DIAGNOSIS — Z419 Encounter for procedure for purposes other than remedying health state, unspecified: Secondary | ICD-10-CM

## 2018-01-15 HISTORY — PX: LAMINECTOMY WITH POSTERIOR LATERAL ARTHRODESIS LEVEL 2: SHX6336

## 2018-01-15 SURGERY — LAMINECTOMY WITH POSTERIOR LATERAL ARTHRODESIS LEVEL 2
Anesthesia: General | Site: Back

## 2018-01-15 MED ORDER — LIDOCAINE 2% (20 MG/ML) 5 ML SYRINGE
INTRAMUSCULAR | Status: AC
  Filled 2018-01-15: qty 10

## 2018-01-15 MED ORDER — SODIUM CHLORIDE 0.9 % IJ SOLN
INTRAMUSCULAR | Status: AC
  Filled 2018-01-15: qty 20

## 2018-01-15 MED ORDER — METOPROLOL SUCCINATE ER 25 MG PO TB24
25.0000 mg | ORAL_TABLET | Freq: Every day | ORAL | Status: DC
  Administered 2018-01-15: 25 mg via ORAL
  Filled 2018-01-15: qty 1

## 2018-01-15 MED ORDER — LACTATED RINGERS IV SOLN
INTRAVENOUS | Status: DC | PRN
  Administered 2018-01-15 (×2): via INTRAVENOUS

## 2018-01-15 MED ORDER — SUGAMMADEX SODIUM 200 MG/2ML IV SOLN
INTRAVENOUS | Status: AC
  Filled 2018-01-15: qty 2

## 2018-01-15 MED ORDER — ONDANSETRON HCL 4 MG/2ML IJ SOLN
INTRAMUSCULAR | Status: AC
  Filled 2018-01-15: qty 2

## 2018-01-15 MED ORDER — SERTRALINE HCL 50 MG PO TABS
150.0000 mg | ORAL_TABLET | Freq: Every day | ORAL | Status: DC
  Administered 2018-01-15: 150 mg via ORAL
  Filled 2018-01-15: qty 3

## 2018-01-15 MED ORDER — BUPIVACAINE HCL (PF) 0.25 % IJ SOLN
INTRAMUSCULAR | Status: DC | PRN
  Administered 2018-01-15: 10 mL

## 2018-01-15 MED ORDER — MIDAZOLAM HCL 2 MG/2ML IJ SOLN
INTRAMUSCULAR | Status: AC
  Filled 2018-01-15: qty 2

## 2018-01-15 MED ORDER — HYDROCODONE-ACETAMINOPHEN 10-325 MG PO TABS
ORAL_TABLET | ORAL | Status: AC
  Filled 2018-01-15: qty 1

## 2018-01-15 MED ORDER — PROPOFOL 10 MG/ML IV BOLUS
INTRAVENOUS | Status: DC | PRN
  Administered 2018-01-15: 130 mg via INTRAVENOUS
  Administered 2018-01-15: 30 mg via INTRAVENOUS

## 2018-01-15 MED ORDER — FENTANYL CITRATE (PF) 250 MCG/5ML IJ SOLN
INTRAMUSCULAR | Status: AC
  Filled 2018-01-15: qty 5

## 2018-01-15 MED ORDER — BUPROPION HCL ER (XL) 300 MG PO TB24
300.0000 mg | ORAL_TABLET | Freq: Every day | ORAL | Status: DC
  Administered 2018-01-16: 300 mg via ORAL
  Filled 2018-01-15: qty 1

## 2018-01-15 MED ORDER — EPHEDRINE SULFATE 50 MG/ML IJ SOLN
INTRAMUSCULAR | Status: AC
  Filled 2018-01-15: qty 2

## 2018-01-15 MED ORDER — HYDROMORPHONE HCL 1 MG/ML IJ SOLN
INTRAMUSCULAR | Status: AC
  Filled 2018-01-15: qty 1

## 2018-01-15 MED ORDER — POTASSIUM CHLORIDE IN NACL 20-0.9 MEQ/L-% IV SOLN: INTRAVENOUS | Status: DC

## 2018-01-15 MED ORDER — GLYCOPYRROLATE PF 0.2 MG/ML IJ SOSY
PREFILLED_SYRINGE | INTRAMUSCULAR | Status: AC
  Filled 2018-01-15: qty 1

## 2018-01-15 MED ORDER — THROMBIN 20000 UNITS EX SOLR
CUTANEOUS | Status: AC
  Filled 2018-01-15: qty 20000

## 2018-01-15 MED ORDER — THROMBIN 20000 UNITS EX SOLR
CUTANEOUS | Status: DC | PRN
  Administered 2018-01-15: 20 mL via TOPICAL

## 2018-01-15 MED ORDER — ONDANSETRON HCL 4 MG/2ML IJ SOLN
INTRAMUSCULAR | Status: DC | PRN
  Administered 2018-01-15: 4 mg via INTRAVENOUS

## 2018-01-15 MED ORDER — METHOCARBAMOL 500 MG PO TABS
ORAL_TABLET | ORAL | Status: AC
  Filled 2018-01-15: qty 1

## 2018-01-15 MED ORDER — HYDROMORPHONE HCL 1 MG/ML IJ SOLN
0.2500 mg | INTRAMUSCULAR | Status: DC | PRN
  Administered 2018-01-15: 1 mg via INTRAVENOUS

## 2018-01-15 MED ORDER — SODIUM CHLORIDE 0.9% FLUSH
3.0000 mL | Freq: Two times a day (BID) | INTRAVENOUS | Status: DC
  Administered 2018-01-15 (×2): 3 mL via INTRAVENOUS

## 2018-01-15 MED ORDER — SODIUM CHLORIDE 0.9 % IV SOLN
INTRAVENOUS | Status: DC | PRN
  Administered 2018-01-15: 500 mL

## 2018-01-15 MED ORDER — SODIUM CHLORIDE 0.9 % IJ SOLN
INTRAMUSCULAR | Status: DC | PRN
  Administered 2018-01-15: 5 mL via INTRAVENOUS

## 2018-01-15 MED ORDER — HEPARIN SODIUM (PORCINE) 1000 UNIT/ML IJ SOLN
INTRAMUSCULAR | Status: DC | PRN
  Administered 2018-01-15: 5000 [IU]

## 2018-01-15 MED ORDER — CEFAZOLIN SODIUM-DEXTROSE 2-4 GM/100ML-% IV SOLN
2.0000 g | INTRAVENOUS | Status: AC
  Administered 2018-01-15: 2 g via INTRAVENOUS
  Filled 2018-01-15: qty 100

## 2018-01-15 MED ORDER — METHOCARBAMOL 1000 MG/10ML IJ SOLN
500.0000 mg | Freq: Four times a day (QID) | INTRAVENOUS | Status: DC | PRN
  Filled 2018-01-15: qty 5

## 2018-01-15 MED ORDER — GABAPENTIN 300 MG PO CAPS
300.0000 mg | ORAL_CAPSULE | Freq: Every day | ORAL | Status: DC
  Administered 2018-01-15: 300 mg via ORAL
  Filled 2018-01-15: qty 1

## 2018-01-15 MED ORDER — IRBESARTAN 75 MG PO TABS
75.0000 mg | ORAL_TABLET | Freq: Every day | ORAL | Status: DC
  Administered 2018-01-16: 75 mg via ORAL
  Filled 2018-01-15: qty 1

## 2018-01-15 MED ORDER — TRAZODONE HCL 100 MG PO TABS
100.0000 mg | ORAL_TABLET | Freq: Every day | ORAL | Status: DC
  Administered 2018-01-15 (×2): 100 mg via ORAL
  Filled 2018-01-15 (×2): qty 1

## 2018-01-15 MED ORDER — LIDOCAINE 2% (20 MG/ML) 5 ML SYRINGE
INTRAMUSCULAR | Status: DC | PRN
  Administered 2018-01-15: 80 mg via INTRAVENOUS

## 2018-01-15 MED ORDER — FENTANYL CITRATE (PF) 100 MCG/2ML IJ SOLN
INTRAMUSCULAR | Status: DC | PRN
  Administered 2018-01-15 (×7): 50 ug via INTRAVENOUS

## 2018-01-15 MED ORDER — OXYCODONE HCL 5 MG PO TABS: 5.0000 mg | ORAL_TABLET | Freq: Once | ORAL | Status: DC | PRN

## 2018-01-15 MED ORDER — HYDROMORPHONE HCL 1 MG/ML IJ SOLN
0.5000 mg | INTRAMUSCULAR | Status: DC | PRN
  Administered 2018-01-15: 0.5 mg via INTRAVENOUS
  Filled 2018-01-15: qty 0.5

## 2018-01-15 MED ORDER — ROCURONIUM BROMIDE 50 MG/5ML IV SOLN
INTRAVENOUS | Status: AC
  Filled 2018-01-15: qty 1

## 2018-01-15 MED ORDER — ONDANSETRON HCL 4 MG/2ML IJ SOLN: 4.0000 mg | Freq: Four times a day (QID) | INTRAMUSCULAR | Status: DC | PRN

## 2018-01-15 MED ORDER — METHOCARBAMOL 500 MG PO TABS
500.0000 mg | ORAL_TABLET | Freq: Four times a day (QID) | ORAL | Status: DC | PRN
  Administered 2018-01-15 – 2018-01-16 (×3): 500 mg via ORAL
  Filled 2018-01-15 (×2): qty 1

## 2018-01-15 MED ORDER — ALPRAZOLAM 0.5 MG PO TABS
1.0000 mg | ORAL_TABLET | Freq: Three times a day (TID) | ORAL | Status: DC | PRN
  Administered 2018-01-16: 1 mg via ORAL
  Filled 2018-01-15: qty 2

## 2018-01-15 MED ORDER — ARTIFICIAL TEARS OPHTHALMIC OINT
TOPICAL_OINTMENT | OPHTHALMIC | Status: AC
  Filled 2018-01-15: qty 3.5

## 2018-01-15 MED ORDER — SENNA 8.6 MG PO TABS
1.0000 | ORAL_TABLET | Freq: Two times a day (BID) | ORAL | Status: DC
  Administered 2018-01-16: 8.6 mg via ORAL
  Filled 2018-01-15 (×2): qty 1

## 2018-01-15 MED ORDER — MENTHOL 3 MG MT LOZG: 1.0000 | LOZENGE | OROMUCOSAL | Status: DC | PRN

## 2018-01-15 MED ORDER — ROSUVASTATIN CALCIUM 5 MG PO TABS
10.0000 mg | ORAL_TABLET | Freq: Every day | ORAL | Status: DC
  Administered 2018-01-15: 10 mg via ORAL
  Filled 2018-01-15: qty 2

## 2018-01-15 MED ORDER — PROPOFOL 10 MG/ML IV BOLUS
INTRAVENOUS | Status: AC
  Filled 2018-01-15: qty 40

## 2018-01-15 MED ORDER — DEXAMETHASONE SODIUM PHOSPHATE 10 MG/ML IJ SOLN
10.0000 mg | INTRAMUSCULAR | Status: AC
  Administered 2018-01-15: 10 mg via INTRAVENOUS
  Filled 2018-01-15: qty 1

## 2018-01-15 MED ORDER — OXYCODONE HCL 5 MG/5ML PO SOLN: 5.0000 mg | Freq: Once | ORAL | Status: DC | PRN

## 2018-01-15 MED ORDER — HYDROCODONE-ACETAMINOPHEN 10-325 MG PO TABS
1.0000 | ORAL_TABLET | ORAL | Status: DC | PRN
  Administered 2018-01-15 – 2018-01-16 (×5): 1 via ORAL
  Filled 2018-01-15 (×4): qty 1

## 2018-01-15 MED ORDER — ONDANSETRON HCL 4 MG PO TABS: 4.0000 mg | ORAL_TABLET | Freq: Four times a day (QID) | ORAL | Status: DC | PRN

## 2018-01-15 MED ORDER — MELATONIN 3 MG PO TABS
3.0000 mg | ORAL_TABLET | Freq: Every evening | ORAL | Status: DC | PRN
  Filled 2018-01-15: qty 1

## 2018-01-15 MED ORDER — ACETAMINOPHEN 325 MG PO TABS: 650.0000 mg | ORAL_TABLET | ORAL | Status: DC | PRN

## 2018-01-15 MED ORDER — VANCOMYCIN HCL 1000 MG IV SOLR
INTRAVENOUS | Status: AC
  Filled 2018-01-15: qty 1000

## 2018-01-15 MED ORDER — CEFAZOLIN SODIUM-DEXTROSE 2-4 GM/100ML-% IV SOLN
2.0000 g | Freq: Three times a day (TID) | INTRAVENOUS | Status: AC
  Administered 2018-01-15 – 2018-01-16 (×2): 2 g via INTRAVENOUS
  Filled 2018-01-15 (×2): qty 100

## 2018-01-15 MED ORDER — THROMBIN 5000 UNITS EX SOLR
OROMUCOSAL | Status: DC | PRN
  Administered 2018-01-15: 5 mL via TOPICAL

## 2018-01-15 MED ORDER — CHLORHEXIDINE GLUCONATE CLOTH 2 % EX PADS: 6.0000 | MEDICATED_PAD | Freq: Once | CUTANEOUS | Status: DC

## 2018-01-15 MED ORDER — SUGAMMADEX SODIUM 200 MG/2ML IV SOLN
INTRAVENOUS | Status: DC | PRN
  Administered 2018-01-15: 150 mg via INTRAVENOUS

## 2018-01-15 MED ORDER — PROMETHAZINE HCL 25 MG/ML IJ SOLN: 6.2500 mg | INTRAMUSCULAR | Status: DC | PRN

## 2018-01-15 MED ORDER — ALBUMIN HUMAN 5 % IV SOLN
INTRAVENOUS | Status: DC | PRN
  Administered 2018-01-15: 12:00:00 via INTRAVENOUS

## 2018-01-15 MED ORDER — CELECOXIB 200 MG PO CAPS
200.0000 mg | ORAL_CAPSULE | Freq: Two times a day (BID) | ORAL | Status: DC
  Administered 2018-01-15 – 2018-01-16 (×2): 200 mg via ORAL
  Filled 2018-01-15 (×3): qty 1

## 2018-01-15 MED ORDER — THROMBIN 5000 UNITS EX SOLR
CUTANEOUS | Status: AC
  Filled 2018-01-15: qty 5000

## 2018-01-15 MED ORDER — LABETALOL HCL 5 MG/ML IV SOLN
INTRAVENOUS | Status: AC
  Filled 2018-01-15: qty 4

## 2018-01-15 MED ORDER — HEPARIN SODIUM (PORCINE) 1000 UNIT/ML IJ SOLN
INTRAMUSCULAR | Status: AC
  Filled 2018-01-15: qty 1

## 2018-01-15 MED ORDER — SERTRALINE HCL 50 MG PO TABS
100.0000 mg | ORAL_TABLET | Freq: Every day | ORAL | Status: DC
  Filled 2018-01-15: qty 2

## 2018-01-15 MED ORDER — BUPIVACAINE HCL (PF) 0.25 % IJ SOLN
INTRAMUSCULAR | Status: AC
  Filled 2018-01-15: qty 30

## 2018-01-15 MED ORDER — ROCURONIUM BROMIDE 10 MG/ML (PF) SYRINGE
PREFILLED_SYRINGE | INTRAVENOUS | Status: DC | PRN
  Administered 2018-01-15 (×2): 10 mg via INTRAVENOUS
  Administered 2018-01-15: 50 mg via INTRAVENOUS

## 2018-01-15 MED ORDER — VANCOMYCIN HCL 1000 MG IV SOLR
INTRAVENOUS | Status: DC | PRN
  Administered 2018-01-15: 1000 mg

## 2018-01-15 MED ORDER — ACETAMINOPHEN 650 MG RE SUPP: 650.0000 mg | RECTAL | Status: DC | PRN

## 2018-01-15 MED ORDER — SODIUM CHLORIDE 0.9% FLUSH: 3.0000 mL | INTRAVENOUS | Status: DC | PRN

## 2018-01-15 MED ORDER — MEPERIDINE HCL 50 MG/ML IJ SOLN: 6.2500 mg | INTRAMUSCULAR | Status: DC | PRN

## 2018-01-15 MED ORDER — NON FORMULARY
Status: DC | PRN
  Administered 2018-01-15: 10 mL

## 2018-01-15 MED ORDER — PHENOL 1.4 % MT LIQD: 1.0000 | OROMUCOSAL | Status: DC | PRN

## 2018-01-15 MED FILL — Anticoagulant Sodium Citrate Soln 4%: Qty: 10 | Status: AC

## 2018-01-15 SURGICAL SUPPLY — 71 items
ADH SKN CLS APL DERMABOND .7 (GAUZE/BANDAGES/DRESSINGS) ×1
APL SKNCLS STERI-STRIP NONHPOA (GAUZE/BANDAGES/DRESSINGS) ×1
BAG DECANTER FOR FLEXI CONT (MISCELLANEOUS) ×3 IMPLANT
BASKET BONE COLLECTION (BASKET) ×2 IMPLANT
BENZOIN TINCTURE PRP APPL 2/3 (GAUZE/BANDAGES/DRESSINGS) ×3 IMPLANT
BLADE CLIPPER SURG (BLADE) IMPLANT
BUR MATCHSTICK NEURO 3.0 LAGG (BURR) ×3 IMPLANT
CANISTER SUCT 3000ML PPV (MISCELLANEOUS) ×3 IMPLANT
CARTRIDGE OIL MAESTRO DRILL (MISCELLANEOUS) ×1 IMPLANT
CLOSURE STERI-STRIP 1/2X4 (GAUZE/BANDAGES/DRESSINGS) ×1
CLOSURE WOUND 1/2 X4 (GAUZE/BANDAGES/DRESSINGS) ×2
CLSR STERI-STRIP ANTIMIC 1/2X4 (GAUZE/BANDAGES/DRESSINGS) ×1 IMPLANT
CONT SPEC 4OZ CLIKSEAL STRL BL (MISCELLANEOUS) ×3 IMPLANT
COVER BACK TABLE 60X90IN (DRAPES) ×3 IMPLANT
DERMABOND ADVANCED (GAUZE/BANDAGES/DRESSINGS) ×2
DERMABOND ADVANCED .7 DNX12 (GAUZE/BANDAGES/DRESSINGS) IMPLANT
DIFFUSER DRILL AIR PNEUMATIC (MISCELLANEOUS) ×3 IMPLANT
DRAPE C-ARM 42X72 X-RAY (DRAPES) ×2 IMPLANT
DRAPE C-ARMOR (DRAPES) ×2 IMPLANT
DRAPE LAPAROTOMY 100X72X124 (DRAPES) ×3 IMPLANT
DRAPE POUCH INSTRU U-SHP 10X18 (DRAPES) ×3 IMPLANT
DRAPE SURG 17X23 STRL (DRAPES) ×3 IMPLANT
DRSG OPSITE POSTOP 4X6 (GAUZE/BANDAGES/DRESSINGS) ×2 IMPLANT
DURAPREP 26ML APPLICATOR (WOUND CARE) ×3 IMPLANT
ELECT REM PT RETURN 9FT ADLT (ELECTROSURGICAL) ×3
ELECTRODE REM PT RTRN 9FT ADLT (ELECTROSURGICAL) ×1 IMPLANT
EVACUATOR 1/8 PVC DRAIN (DRAIN) IMPLANT
GAUZE SPONGE 4X4 16PLY XRAY LF (GAUZE/BANDAGES/DRESSINGS) ×2 IMPLANT
GLOVE BIO SURGEON STRL SZ7 (GLOVE) ×3 IMPLANT
GLOVE BIO SURGEON STRL SZ8 (GLOVE) ×6 IMPLANT
GLOVE BIOGEL PI IND STRL 6.5 (GLOVE) IMPLANT
GLOVE BIOGEL PI IND STRL 7.0 (GLOVE) IMPLANT
GLOVE BIOGEL PI INDICATOR 6.5 (GLOVE) ×8
GLOVE BIOGEL PI INDICATOR 7.0 (GLOVE) ×2
GLOVE ECLIPSE 6.5 STRL STRAW (GLOVE) ×2 IMPLANT
GLOVE SURG SS PI 6.5 STRL IVOR (GLOVE) ×10 IMPLANT
GOWN STRL REUS W/ TWL LRG LVL3 (GOWN DISPOSABLE) ×4 IMPLANT
GOWN STRL REUS W/ TWL XL LVL3 (GOWN DISPOSABLE) ×2 IMPLANT
GOWN STRL REUS W/TWL 2XL LVL3 (GOWN DISPOSABLE) IMPLANT
GOWN STRL REUS W/TWL LRG LVL3 (GOWN DISPOSABLE) ×12
GOWN STRL REUS W/TWL XL LVL3 (GOWN DISPOSABLE) ×6
HEMOSTAT POWDER KIT SURGIFOAM (HEMOSTASIS) ×3 IMPLANT
KIT BASIN OR (CUSTOM PROCEDURE TRAY) ×3 IMPLANT
KIT BONE MRW ASP ANGEL CPRP (KITS) ×3 IMPLANT
KIT TURNOVER KIT B (KITS) ×3 IMPLANT
MATRIX STRIP NEOCORE 12C (Putty) IMPLANT
MILL MEDIUM DISP (BLADE) ×2 IMPLANT
NEEDLE HYPO 25X1 1.5 SAFETY (NEEDLE) ×3 IMPLANT
NS IRRIG 1000ML POUR BTL (IV SOLUTION) ×3 IMPLANT
OIL CARTRIDGE MAESTRO DRILL (MISCELLANEOUS) ×3
PACK LAMINECTOMY NEURO (CUSTOM PROCEDURE TRAY) ×3 IMPLANT
PAD ARMBOARD 7.5X6 YLW CONV (MISCELLANEOUS) ×9 IMPLANT
PUTTY DBM ALLOSYNC PURE 10CC (Putty) ×2 IMPLANT
ROD PC 5.5X45 TI ARSENAL (Rod) ×2 IMPLANT
ROD PC 5.5X55 TI ARSENAL (Rod) ×2 IMPLANT
SCREW CANC CBX 5.0X35 (Screw) ×4 IMPLANT
SCREW CBX 5.0X40MM (Screw) ×4 IMPLANT
SCREW CBX 6.5X35MM (Screw) ×4 IMPLANT
SCREW SET SPINAL ARSENAL 47127 (Screw) ×18 IMPLANT
SPONGE LAP 4X18 RFD (DISPOSABLE) IMPLANT
SPONGE SURGIFOAM ABS GEL 100 (HEMOSTASIS) ×3 IMPLANT
STRIP CLOSURE SKIN 1/2X4 (GAUZE/BANDAGES/DRESSINGS) ×4 IMPLANT
STRIP MATRIX NEOCORE 12CC (Putty) ×2 IMPLANT
SUT VIC AB 0 CT1 18XCR BRD8 (SUTURE) ×1 IMPLANT
SUT VIC AB 0 CT1 8-18 (SUTURE) ×3
SUT VIC AB 2-0 CP2 18 (SUTURE) ×3 IMPLANT
SUT VIC AB 3-0 SH 8-18 (SUTURE) ×6 IMPLANT
TOWEL GREEN STERILE (TOWEL DISPOSABLE) ×3 IMPLANT
TOWEL GREEN STERILE FF (TOWEL DISPOSABLE) ×3 IMPLANT
TRAY FOLEY MTR SLVR 16FR STAT (SET/KITS/TRAYS/PACK) ×2 IMPLANT
WATER STERILE IRR 1000ML POUR (IV SOLUTION) ×3 IMPLANT

## 2018-01-15 NOTE — Progress Notes (Signed)
Subjective: Patient reports appropriate back soreness, no leg pain or NTW, walked short distance and was More upright  Objective: Vital signs in last 24 hours: Temp:  [97.6 F (36.4 C)-98.3 F (36.8 C)] 97.6 F (36.4 C) (06/19 1409) Pulse Rate:  [62-68] 68 (06/19 1409) Resp:  [9-20] 20 (06/19 1409) BP: (143-147)/(85-91) 143/85 (06/19 1409) SpO2:  [93 %-100 %] 98 % (06/19 1409) Weight:  [62.1 kg (137 lb)] 62.1 kg (137 lb) (06/19 0659)  Intake/Output from previous day: No intake/output data recorded. Intake/Output this shift: Total I/O In: 2110 [P.O.:360; I.V.:1400; IV Piggyback:350] Out: 1525 [Urine:1025; Blood:500]  Neurologic: Grossly normal, good DF/ PF  Lab Results: Lab Results  Component Value Date   WBC 10.9 (H) 01/07/2018   HGB 11.3 (L) 01/07/2018   HCT 36.7 01/07/2018   MCV 90.6 01/07/2018   PLT 254 01/07/2018   Lab Results  Component Value Date   INR 1.00 01/07/2018   BMET Lab Results  Component Value Date   NA 145 01/07/2018   K 4.1 01/07/2018   CL 110 01/07/2018   CO2 28 01/07/2018   GLUCOSE 101 (H) 01/07/2018   BUN 14 01/07/2018   CREATININE 1.37 (H) 01/07/2018   CALCIUM 9.9 01/07/2018    Studies/Results: Dg Lumbar Spine 2-3 Views  Result Date: 01/15/2018 CLINICAL DATA:  Lumbar fusion EXAM: DG C-ARM 61-120 MIN; LUMBAR SPINE - 2-3 VIEW COMPARISON:  None. FLUOROSCOPY TIME:  Radiation Exposure Index (as provided by the fluoroscopic device): Not available If the device does not provide the exposure index: Fluoroscopy Time:  1 minutes 40 seconds Number of Acquired Images:  3 FINDINGS: Pedicle screws are noted at L4, L5 and S1 with posterior fixation. No other focal abnormality is seen. IMPRESSION: L4-5 and L5-S1 fusion. Electronically Signed   By: Inez Catalina M.D.   On: 01/15/2018 12:25   Dg C-arm 1-60 Min  Result Date: 01/15/2018 CLINICAL DATA:  Lumbar fusion EXAM: DG C-ARM 61-120 MIN; LUMBAR SPINE - 2-3 VIEW COMPARISON:  None. FLUOROSCOPY TIME:   Radiation Exposure Index (as provided by the fluoroscopic device): Not available If the device does not provide the exposure index: Fluoroscopy Time:  1 minutes 40 seconds Number of Acquired Images:  3 FINDINGS: Pedicle screws are noted at L4, L5 and S1 with posterior fixation. No other focal abnormality is seen. IMPRESSION: L4-5 and L5-S1 fusion. Electronically Signed   By: Inez Catalina M.D.   On: 01/15/2018 12:25   Dg C-arm 1-60 Min  Result Date: 01/15/2018 CLINICAL DATA:  Lumbar fusion EXAM: DG C-ARM 61-120 MIN; LUMBAR SPINE - 2-3 VIEW COMPARISON:  None. FLUOROSCOPY TIME:  Radiation Exposure Index (as provided by the fluoroscopic device): Not available If the device does not provide the exposure index: Fluoroscopy Time:  1 minutes 40 seconds Number of Acquired Images:  3 FINDINGS: Pedicle screws are noted at L4, L5 and S1 with posterior fixation. No other focal abnormality is seen. IMPRESSION: L4-5 and L5-S1 fusion. Electronically Signed   By: Inez Catalina M.D.   On: 01/15/2018 12:25    Assessment/Plan: Doing great after lumbar decompression fusion  Estimated body mass index is 25.06 kg/m as calculated from the following:   Height as of this encounter: 5\' 2"  (1.575 m).   Weight as of this encounter: 62.1 kg (137 lb).    LOS: 0 days    Martha Higgins 01/15/2018, 4:55 PM

## 2018-01-15 NOTE — Anesthesia Procedure Notes (Signed)
Procedure Name: Intubation Date/Time: 01/15/2018 8:42 AM Performed by: Colin Benton, CRNA Pre-anesthesia Checklist: Patient identified, Emergency Drugs available, Suction available and Patient being monitored Patient Re-evaluated:Patient Re-evaluated prior to induction Oxygen Delivery Method: Circle system utilized Preoxygenation: Pre-oxygenation with 100% oxygen Induction Type: IV induction Ventilation: Mask ventilation without difficulty Laryngoscope Size: Miller and 2 Grade View: Grade I Tube type: Oral Tube size: 7.0 mm Number of attempts: 1 Airway Equipment and Method: Stylet Placement Confirmation: ETT inserted through vocal cords under direct vision,  positive ETCO2 and breath sounds checked- equal and bilateral Secured at: 22 cm Tube secured with: Tape Dental Injury: Teeth and Oropharynx as per pre-operative assessment

## 2018-01-15 NOTE — Anesthesia Preprocedure Evaluation (Signed)
Anesthesia Evaluation  Patient identified by MRN, date of birth, ID band Patient awake    Reviewed: Allergy & Precautions, NPO status , Patient's Chart, lab work & pertinent test results, reviewed documented beta blocker date and time   Airway Mallampati: II  TM Distance: >3 FB Neck ROM: Full    Dental no notable dental hx.    Pulmonary neg pulmonary ROS, sleep apnea ,    Pulmonary exam normal breath sounds clear to auscultation       Cardiovascular hypertension, Pt. on medications and Pt. on home beta blockers negative cardio ROS Normal cardiovascular exam Rhythm:Regular Rate:Normal     Neuro/Psych  Headaches, negative neurological ROS  negative psych ROS   GI/Hepatic negative GI ROS, Neg liver ROS, GERD  ,  Endo/Other  negative endocrine ROS  Renal/GU negative Renal ROS  negative genitourinary   Musculoskeletal negative musculoskeletal ROS (+) Arthritis ,   Abdominal   Peds negative pediatric ROS (+)  Hematology negative hematology ROS (+)   Anesthesia Other Findings   Reproductive/Obstetrics negative OB ROS                             Anesthesia Physical Anesthesia Plan  ASA: II  Anesthesia Plan: General   Post-op Pain Management:    Induction: Intravenous  PONV Risk Score and Plan: 3 and Ondansetron, Dexamethasone, Midazolam and Treatment may vary due to age or medical condition  Airway Management Planned: Oral ETT  Additional Equipment:   Intra-op Plan:   Post-operative Plan: Extubation in OR  Informed Consent: I have reviewed the patients History and Physical, chart, labs and discussed the procedure including the risks, benefits and alternatives for the proposed anesthesia with the patient or authorized representative who has indicated his/her understanding and acceptance.   Dental advisory given  Plan Discussed with: CRNA  Anesthesia Plan Comments:          Anesthesia Quick Evaluation
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
## 2018-01-15 NOTE — Evaluation (Signed)
Physical Therapy Evaluation Patient Details Name: Martha Higgins MRN: [id number] DOB: 1946/04/19 Today's Date: 01/15/2018   History of Present Illness  72 yo female with PMH including arthritis, GERD, heart murmur, and HTN. S/p laminectomy L4-5 and L5-S1 and Posterior fixation L4-S1 on 01/15/18.   Clinical Impression  Patient is s/p above surgery resulting in the deficits listed below (see PT Problem List). Pt very guarded during gait and presenting with mild unsteadiness. Required min to min guard A throughout. Educated about use of DME at home, back precautions, and generalized walking program. Patient will benefit from skilled PT to increase their independence and safety with mobility (while adhering to their precautions) to allow discharge to the venue listed below.     Follow Up Recommendations No PT follow up;Supervision for mobility/OOB    Equipment Recommendations  3in1 (PT)    Recommendations for Other Services       Precautions / Restrictions Precautions Precautions: Fall;Back Precaution Booklet Issued: Yes (comment) Precaution Comments: Reviewed back precautions with pt and pt's family.  Required Braces or Orthoses: Spinal Brace Spinal Brace: Lumbar corset Restrictions Weight Bearing Restrictions: No      Mobility  Bed Mobility Overal bed mobility: Needs Assistance Bed Mobility: Rolling;Sidelying to Sit;Sit to Sidelying Rolling: Supervision Sidelying to sit: Supervision     Sit to sidelying: Supervision General bed mobility comments: Supervision to ensure log roll technique. Verbal cues for sequencing.   Transfers Overall transfer level: Needs assistance Equipment used: 1 person hand held assist Transfers: Sit to/from Stand Sit to Stand: Min assist         General transfer comment: Min A for lift assist and steadying.   Ambulation/Gait Ambulation/Gait assistance: Min assist;Min guard Gait Distance (Feet): 125 Feet Assistive device: 2 person hand  held assist Gait Pattern/deviations: Step-through pattern;Decreased stride length Gait velocity: Decreased    General Gait Details: Very slow, very guarded gait. Pt insistent on holding husband's hand as well as PT's hand during gait. Min to min guard for steadying assist. Educated about generalized walking program to perform at home. Educated about use of RW at home.   Stairs            Wheelchair Mobility    Modified Rankin (Stroke Patients Only)       Balance Overall balance assessment: Needs assistance Sitting-balance support: No upper extremity supported;Feet supported Sitting balance-Leahy Scale: Good     Standing balance support: Bilateral upper extremity supported;During functional activity Standing balance-Leahy Scale: Poor Standing balance comment: Reliant on BUE support                              Pertinent Vitals/Pain Pain Assessment: Faces Faces Pain Scale: Hurts even more Pain Location: back  Pain Descriptors / Indicators: Aching;Operative site guarding Pain Intervention(s): Limited activity within patient's tolerance;Monitored during session;Repositioned    Home Living Family/patient expects to be discharged to:: Private residence Living Arrangements: Spouse/significant other Available Help at Discharge: Family;Available 24 hours/day Type of Home: House Home Access: Stairs to enter Entrance Stairs-Rails: None Entrance Stairs-Number of Steps: 4 Home Layout: One level Home Equipment: Walker - 2 wheels      Prior Function Level of Independence: Independent               Hand Dominance        Extremity/Trunk Assessment   Upper Extremity Assessment Upper Extremity Assessment: Defer to OT evaluation    Lower Extremity Assessment Lower Extremity  Assessment: Generalized weakness    Cervical / Trunk Assessment Cervical / Trunk Assessment: Other exceptions Cervical / Trunk Exceptions: s/p lumbar laminectomy and posterior  fixation   Communication   Communication: No difficulties  Cognition Arousal/Alertness: Awake/alert Behavior During Therapy: WFL for tasks assessed/performed Overall Cognitive Status: Within Functional Limits for tasks assessed                                        General Comments General comments (skin integrity, edema, etc.): Pt's husband present throughout.     Exercises     Assessment/Plan    PT Assessment Patient needs continued PT services  PT Problem List Decreased strength;Decreased balance;Decreased mobility;Decreased knowledge of use of DME;Decreased knowledge of precautions;Pain       PT Treatment Interventions DME instruction;Gait training;Stair training;Functional mobility training;Therapeutic activities;Balance training;Therapeutic exercise;Patient/family education    PT Goals (Current goals can be found in the Care Plan section)  Acute Rehab PT Goals Patient Stated Goal: to go home  PT Goal Formulation: With patient Time For Goal Achievement: 01/29/18 Potential to Achieve Goals: Good    Frequency Min 5X/week   Barriers to discharge        Co-evaluation               AM-PAC PT "6 Clicks" Daily Activity  Outcome Measure Difficulty turning over in bed (including adjusting bedclothes, sheets and blankets)?: A Little Difficulty moving from lying on back to sitting on the side of the bed? : A Little Difficulty sitting down on and standing up from a chair with arms (e.g., wheelchair, bedside commode, etc,.)?: Unable Help needed moving to and from a bed to chair (including a wheelchair)?: A Little Help needed walking in hospital room?: A Little Help needed climbing 3-5 steps with a railing? : A Lot 6 Click Score: 15    End of Session Equipment Utilized During Treatment: Gait belt Activity Tolerance: Patient tolerated treatment well Patient left: in bed;with call bell/phone within reach;with family/visitor present Nurse Communication:  Mobility status PT Visit Diagnosis: Other abnormalities of gait and mobility (R26.89);Pain Pain - part of body: (back)    Time: 0102-7253 PT Time Calculation (min) (ACUTE ONLY): 23 min   Charges:   PT Evaluation $PT Eval Low Complexity: 1 Low PT Treatments $Gait Training: 8-22 mins   PT G Codes:        Leighton Ruff, PT, DPT  Acute Rehabilitation Services  Pager: [phone]   Rudean Hitt 01/15/2018, 6:15 PM

## 2018-01-15 NOTE — Anesthesia Postprocedure Evaluation (Signed)
Anesthesia Post Note  Patient: SIBYL MIKULA  Procedure(s) Performed: Posterior Lateral Fusion - Lumbar four-Lumbar five - Lumbar five-Sacral one Decompressive Laminectomy Lumbar four-five Lumbar five-Sacral one, segmental fixation Lumbar four-Sacral one (N/A Back)     Patient location during evaluation: PACU Anesthesia Type: General Level of consciousness: awake and alert Pain management: pain level controlled Vital Signs Assessment: post-procedure vital signs reviewed and stable Respiratory status: spontaneous breathing, nonlabored ventilation and respiratory function stable Cardiovascular status: blood pressure returned to baseline and stable Postop Assessment: no apparent nausea or vomiting Anesthetic complications: no    Last Vitals:  Vitals:   01/15/18 1328 01/15/18 1409  BP: (!) 143/85 (!) 143/85  Pulse: 62 68  Resp:  20  Temp:  36.4 C  SpO2: 100% 98%    Last Pain:  Vitals:   01/15/18 1409  TempSrc: Oral  PainSc:                  Lynda Rainwater

## 2018-01-15 NOTE — Transfer of Care (Signed)
Immediate Anesthesia Transfer of Care Note  Patient: KASSONDRA GEIL  Procedure(s) Performed: Posterior Lateral Fusion - Lumbar four-Lumbar five - Lumbar five-Sacral one Decompressive Laminectomy Lumbar four-five Lumbar five-Sacral one, segmental fixation Lumbar four-Sacral one (N/A Back)  Patient Location: PACU  Anesthesia Type:General  Level of Consciousness: drowsy and patient cooperative  Airway & Oxygen Therapy: Patient Spontanous Breathing and Patient connected to nasal cannula oxygen  Post-op Assessment: Report given to RN and Post -op Vital signs reviewed and stable  Post vital signs: Reviewed and stable  Last Vitals:  Vitals Value Taken Time  BP 147/90 01/15/2018 12:51 PM  Temp    Pulse 64 01/15/2018 12:54 PM  Resp 13 01/15/2018 12:54 PM  SpO2 100 % 01/15/2018 12:54 PM  Vitals shown include unvalidated device data.  Last Pain:  Vitals:   01/15/18 0744  TempSrc:   PainSc: 4          Complications: No apparent anesthesia complications

## 2018-01-15 NOTE — Op Note (Signed)
01/15/2018  12:38 PM  PATIENT:  Martha Higgins  72 y.o. female  PRE-OPERATIVE DIAGNOSIS:  Anterolisthesis L5-S1 with severe foraminal stenosis, lateral recess stenosis L4-5, back and bilateral leg pain  POST-OPERATIVE DIAGNOSIS:  same  PROCEDURE:   1. Decompressive lumbar laminectomy L4-5 and L5-S1 in order to adequately decompress the neural elements and address the spinal stenosis  2. Posterior fixation L4-S1 inclusive using atec cortical pedicle screws.   3. Intertransverse arthrodesis L4-S1 using morcellized autograft and allograft soaked with bone marrow aspirate obtained through a separate fascial incision over the right iliac crest.    SURGEON:  Sherley Bounds, MD  ASSISTANTS: Dr. Christella Noa  ANESTHESIA:  General  EBL: 500 ml  Total I/O In: 1250 [I.V.:1000; IV Piggyback:250] Out: 5681 [Urine:1025; Blood:500]  BLOOD ADMINISTERED:none  DRAINS: none   INDICATION FOR PROCEDURE: This patient presented with back and bilateral leg pain right greater than left. Imaging revealed spinal stenosis L4-5 L5-S1 with spondylolisthesis. The patient tried a reasonable attempt at conservative medical measures without relief. I recommended decompression and instrumented fusion to address the stenosis as well as the segmental  instability.  Patient understood the risks, benefits, and alternatives and potential outcomes and wished to proceed.  PROCEDURE DETAILS:  The patient was brought to the operating room. After induction of generalized endotracheal anesthesia the patient was rolled into the prone position on chest rolls and all pressure points were padded. The patient's lumbar region was cleaned and then prepped with DuraPrep and draped in the usual sterile fashion. Anesthesia was injected and then a dorsal midline incision was made and carried down to the lumbosacral fascia. The fascia was opened and the paraspinous musculature was taken down in a subperiosteal fashion to expose L4-5 and  L5-S1. A self-retaining retractor was placed. Intraoperative fluoroscopy confirmed my level, and I started with placement of the L4 cortical pedicle screws. The pedicle screw entry zones were identified utilizing surface landmarks and  AP and lateral fluoroscopy. I scored the cortex with the high-speed drill and then used the hand drill to drill an upward and outward direction into the pedicle. I then tapped line to line. I then placed a 5.0 x 35 mm cortical pedicle screw into the pedicles of L4 bilaterally. I dissected in a suprafascial plane to expose the right iliac crest. The fascia was opened and a Jamshidi needle was used to extract 60 mL of bone marrow aspirate from the right iliac crest. This was spun down for arthrodesis. The fascia was closed. I then turned my attention to the decompression and complete lumbar laminectomies, hemi- facetectomies, and foraminotomies were performed at L4-5 and L5-S1. The patient had significant spinal stenosis especially at L5-S1 on the right where she had severe facet arthropathy and significant facet overgrowth.  Much more generous decompression and generous foraminotomy was undertaken in order to adequately decompress the neural elements and address the patient's leg pain. The yellow ligament was removed to expose the underlying dura and nerve roots, and generous foraminotomies were performed to adequately decompress the neural elements. Both the exiting and traversing nerve roots were decompressed on both sides until a coronary dilator passed easily along the nerve roots. Into significant time to decompress the L5 nerve roots, especially on the right. There was significant cheesy like tissue over the nose noted in the foramen. There was severe compression of the right L5 and S1 nerve roots. Once the decompression was complete palpated with a coronary dilator was passed easily to assure adequate decompression of the  nerve roots. We then turned our attention to the  placement of the lower pedicle screws. The pedicle screw entry zones were identified utilizing surface landmarks and fluoroscopy. I drilled into each pedicle utilizing the hand drill, and tapped each pedicle with the appropriate tap. We palpated with a ball probe to assure no break in the cortex. We then placed 5.0 x 35 mm pedicle screws into the pedicles bilaterally at L5 and 6.5 x 35 mm pedicle screws at S1. We then decorticated the transverse processes and laid a mixture of morcellized autograft and allograft out over these to perform intertransverse arthrodesis at L4-S1 bilaterally. We then placed lordotic rods into the multiaxial screw heads of the pedicle screws and locked these in position with the locking caps and anti-torque device. We then checked our construct with AP and lateral fluoroscopy. Irrigated with copious amounts of bacitracin-containing saline solution. Inspected the nerve roots once again to assure adequate decompression, lined to the dura with Gelfoam, placed powdered vancomycin into the wound, and closed the muscle and the fascia with 0 Vicryl. Closed the subcutaneous tissues with 2-0 Vicryl and subcuticular tissues with 3-0 Vicryl. The skin was closed with benzoin and Steri-Strips. Dressing was then applied, the patient was awakened from general anesthesia and transported to the recovery room in stable condition. At the end of the procedure all sponge, needle and instrument counts were correct.   PLAN OF CARE: admit to inpatient  PATIENT DISPOSITION:  PACU - hemodynamically stable.   Delay start of Pharmacological VTE agent (>24hrs) due to surgical blood loss or risk of bleeding:  yes

## 2018-01-15 NOTE — Progress Notes (Signed)
Orthopedic Tech Progress Note Patient Details:  Martha Higgins 1945-08-24 [id number] Patient already has brace. Patient ID: Martha Higgins, female   DOB: 19-Apr-1946, 72 y.o.   MRN: [id number]   Martha Higgins 01/15/2018, 5:08 PM

## 2018-01-16 ENCOUNTER — Encounter (HOSPITAL_COMMUNITY): Payer: Self-pay | Admitting: Neurological Surgery

## 2018-01-16 ENCOUNTER — Other Ambulatory Visit: Payer: Self-pay

## 2018-01-16 MED ORDER — HYDROCODONE-ACETAMINOPHEN 10-325 MG PO TABS: 1.0000 | ORAL_TABLET | ORAL | 0 refills | Status: DC | PRN

## 2018-01-16 MED ORDER — LACTATED RINGERS IV SOLN
INTRAVENOUS | Status: DC
  Administered 2018-01-16: 50 mL via INTRAVENOUS

## 2018-01-16 MED ORDER — METHOCARBAMOL 500 MG PO TABS: 500.0000 mg | ORAL_TABLET | Freq: Four times a day (QID) | ORAL | 0 refills | Status: DC | PRN

## 2018-01-16 NOTE — Progress Notes (Signed)
Patient is discharged from room 3C06 at this time. Alert and in stable condition. IV site d/c'[d and instructions read to patient and spouse with understanding verbalized. Left unit via wheelchair with all belongings at side. 

## 2018-01-16 NOTE — Evaluation (Signed)
Occupational Therapy Evaluation and Discharge Patient Details Name: Martha Higgins MRN: [id number] DOB: 07-Dec-1945 Today's Date: 01/16/2018    History of Present Illness 72 yo female with PMH including arthritis, GERD, heart murmur, and HTN. S/p laminectomy L4-5 and L5-S1 and Posterior fixation L4-S1 on 01/15/18.    Clinical Impression   This 72 yo female admitted and underwent above presents to acute OT with all education completed with pt and husband we will D/C from acute OT.    Follow Up Recommendations  No OT follow up;Supervision - Intermittent    Equipment Recommendations  None recommended by OT       Precautions / Restrictions Precautions Precautions: Fall;Back Precaution Booklet Issued: Yes (comment) Precaution Comments: Reviewed back precautions with pt and pt's family.  Required Braces or Orthoses: Spinal Brace Spinal Brace: Lumbar corset;Applied in sitting position Restrictions Weight Bearing Restrictions: No             ADL either performed or assessed with clinical judgement   ADL Overall ADL's : Needs assistance/impaired                                       General ADL Comments: Educated pt and husband on best sequence of getting dressed, how to use 3n1 as a shower seat in tub/shower combo, side stepping into tub, use of 2 cups for brushing teeth, and using wet wipes for back peri care. pt and husband report they are aware of how to don and doff brace. Offered to have pt practice any of these education points and she politely declined stating that she felt she understood all by my instructions     Vision Patient Visual Report: No change from baseline              Pertinent Vitals/Pain Pain Assessment: Faces Faces Pain Scale: Hurts even more Pain Location: back  Pain Descriptors / Indicators: Aching;Operative site guarding Pain Intervention(s): Monitored during session     Hand Dominance Right   Extremity/Trunk Assessment  Upper Extremity Assessment Upper Extremity Assessment: Overall WFL for tasks assessed           Communication Communication Communication: No difficulties   Cognition Arousal/Alertness: Awake/alert Behavior During Therapy: WFL for tasks assessed/performed Overall Cognitive Status: Within Functional Limits for tasks assessed                                                Home Living Family/patient expects to be discharged to:: Private residence Living Arrangements: Spouse/significant other Available Help at Discharge: Family;Available 24 hours/day Type of Home: House Home Access: Stairs to enter [employer] of Steps: 4 Entrance Stairs-Rails: None Home Layout: One level     Bathroom Shower/Tub: Tub/shower unit;Curtain   Biochemist, clinical: Standard     Home Equipment: Environmental consultant - 2 wheels          Prior Functioning/Environment Level of Independence: Independent                 OT Problem List: Pain         OT Goals(Current goals can be found in the care plan section) Acute Rehab OT Goals Patient Stated Goal: to go home today  OT Frequency:  AM-PAC PT "6 Clicks" Daily Activity     Outcome Measure Help from another person eating meals?: None Help from another person taking care of personal grooming?: None Help from another person toileting, which includes using toliet, bedpan, or urinal?: A Little Help from another person bathing (including washing, rinsing, drying)?: A Little Help from another person to put on and taking off regular upper body clothing?: None Help from another person to put on and taking off regular lower body clothing?: A Little 6 Click Score: 21   End of Session Nurse Communication: (need for 3n1)  Activity Tolerance: Patient tolerated treatment well Patient left: in bed  OT Visit Diagnosis: Pain;Muscle weakness (generalized) (M62.81) Pain - part of body: (back)                Time:  0454-0981 OT Time Calculation (min): 20 min Charges:  OT General Charges $OT Visit: 1 Visit OT Evaluation $OT Eval Moderate Complexity: [place], [place] [phone] 01/16/2018

## 2018-01-16 NOTE — Progress Notes (Signed)
Physical Therapy Treatment Patient Details Name: Martha Higgins MRN: [id number] DOB: 04/27/1946 Today's Date: 01/16/2018    History of Present Illness 72 yo female with PMH including arthritis, GERD, heart murmur, and HTN. S/p laminectomy L4-5 and L5-S1 and Posterior fixation L4-S1 on 01/15/18.     PT Comments    Pt progressing well with post-op mobility. Husband is very supportive and willing to assist throughout session. Pt prefers to hold to husband vs use of an AD, however has a RW and SPC at home if needed. Husband educated in safety with guarding and assisting pt at home. They were also educated in stair negotiation, car transfer, safe activity progression, and brace application/wearing schedule. Will continue to follow and progress as able per POC.   Follow Up Recommendations  No PT follow up;Supervision for mobility/OOB     Equipment Recommendations  3in1 (PT)    Recommendations for Other Services       Precautions / Restrictions Precautions Precautions: Fall;Back Precaution Booklet Issued: Yes (comment) Precaution Comments: Reviewed back precautions with pt and pt's family.  Required Braces or Orthoses: Spinal Brace Spinal Brace: Lumbar corset Restrictions Weight Bearing Restrictions: No    Mobility  Bed Mobility Overal bed mobility: Needs Assistance Bed Mobility: Rolling;Sidelying to Sit;Sit to Sidelying Rolling: Supervision Sidelying to sit: Supervision     Sit to sidelying: Supervision General bed mobility comments: Supervision to ensure log roll technique. Verbal cues for sequencing.   Transfers Overall transfer level: Needs assistance Equipment used: None Transfers: Sit to/from Stand Sit to Stand: Min assist         General transfer comment: Pt was able to power-up to full stand without assistance. Appears guarded with hands raised to trunk once standing.   Ambulation/Gait Ambulation/Gait assistance: Min assist;Min guard Gait Distance (Feet):  200 Feet Assistive device: 1 person hand held assist Gait Pattern/deviations: Step-through pattern;Decreased stride length Gait velocity: Decreased  Gait velocity interpretation: 1.31 - 2.62 ft/sec, indicative of limited community ambulator General Gait Details: Pt wanting to hold to husband's hand. Therapist educated husband on proper way to guard patient and provide HHA. Overall pt slow and guarded with flexed trunk. Able to make some corrective changes for posture but unable to maintain.    Stairs Stairs: Yes Stairs assistance: Min assist Stair Management: No rails;Forwards;Step to pattern Number of Stairs: 3 General stair comments: VC's for sequencing and safety. Pt's husband present as well and he was educated in how to properly guard patient with ascending and descending.    Wheelchair Mobility    Modified Rankin (Stroke Patients Only)       Balance Overall balance assessment: Needs assistance Sitting-balance support: No upper extremity supported;Feet supported Sitting balance-Leahy Scale: Good     Standing balance support: Bilateral upper extremity supported;During functional activity Standing balance-Leahy Scale: Poor Standing balance comment: Reliant on BUE support                             Cognition Arousal/Alertness: Awake/alert Behavior During Therapy: WFL for tasks assessed/performed Overall Cognitive Status: Within Functional Limits for tasks assessed                                        Exercises      General Comments        Pertinent Vitals/Pain Pain Assessment: Faces Faces Pain Scale: Hurts even  more Pain Location: back  Pain Descriptors / Indicators: Aching;Operative site guarding Pain Intervention(s): Monitored during session    Home Living Family/patient expects to be discharged to:: Private residence Living Arrangements: Spouse/significant other                  Prior Function            PT  Goals (current goals can now be found in the care plan section) Acute Rehab PT Goals Patient Stated Goal: to go home today PT Goal Formulation: With patient Time For Goal Achievement: 01/29/18 Potential to Achieve Goals: Good Progress towards PT goals: Progressing toward goals    Frequency    Min 5X/week      PT Plan Current plan remains appropriate    Co-evaluation              AM-PAC PT "6 Clicks" Daily Activity  Outcome Measure  Difficulty turning over in bed (including adjusting bedclothes, sheets and blankets)?: A Little Difficulty moving from lying on back to sitting on the side of the bed? : A Little Difficulty sitting down on and standing up from a chair with arms (e.g., wheelchair, bedside commode, etc,.)?: A Little Help needed moving to and from a bed to chair (including a wheelchair)?: A Little Help needed walking in hospital room?: A Little Help needed climbing 3-5 steps with a railing? : A Little 6 Click Score: 18    End of Session Equipment Utilized During Treatment: Gait belt Activity Tolerance: Patient tolerated treatment well Patient left: in bed;with call bell/phone within reach;with family/visitor present Nurse Communication: Mobility status PT Visit Diagnosis: Other abnormalities of gait and mobility (R26.89);Pain Pain - part of body: (back)     Time: 4825-0037 PT Time Calculation (min) (ACUTE ONLY): 33 min  Charges:  $Gait Training: 23-37 mins                    G Codes:       Rolinda Roan, PT, DPT Acute Rehabilitation Services Pager: [phone]    Thelma Comp 01/16/2018, 11:15 AM

## 2018-01-16 NOTE — Discharge Summary (Signed)
Physician Discharge Summary  Patient ID: Martha Higgins MRN: [id number] DOB/AGE: 1945/10/06 72 y.o.  Admit date: 01/15/2018 Discharge date: 01/16/2018  Admission Diagnoses:Anterolisthesis L5-S1 with severe foraminal stenosis, lateral recess stenosis L4-5, back and bilateral leg pain   Discharge Diagnoses: same  Discharged Condition: good  Hospital Course: The patient was admitted on 01/15/2018 and taken to the operating room where the patient underwent decompression and posterior lateral fusion L4-S1. The patient tolerated the procedure well and was taken to the recovery room and then to the floor in stable condition. The hospital course was routine. There were no complications. The wound remained clean dry and intact. Pt had appropriate back soreness. No complaints of leg pain or new N/T/W. The patient remained afebrile with stable vital signs, and tolerated a regular diet. The patient continued to increase activities, and pain was well controlled with oral pain medications.   Consults: None  Significant Diagnostic Studies:  Results for orders placed or performed during the hospital encounter of 01/07/18  Surgical pcr screen  Result Value Ref Range   MRSA, PCR NEGATIVE NEGATIVE   Staphylococcus aureus POSITIVE (A) NEGATIVE  Basic metabolic panel  Result Value Ref Range   Sodium 145 135 - 145 mmol/L   Potassium 4.1 3.5 - 5.1 mmol/L   Chloride 110 101 - 111 mmol/L   CO2 28 22 - 32 mmol/L   Glucose, Bld 101 (H) 65 - 99 mg/dL   BUN 14 6 - 20 mg/dL   Creatinine, Ser 1.37 (H) 0.44 - 1.00 mg/dL   Calcium 9.9 8.9 - 10.3 mg/dL   GFR calc non Af Amer 38 (L) >60 mL/min   GFR calc Af Amer 43 (L) >60 mL/min   Anion gap 7 5 - 15  CBC WITH DIFFERENTIAL  Result Value Ref Range   WBC 10.9 (H) 4.0 - 10.5 K/uL   RBC 4.05 3.87 - 5.11 MIL/uL   Hemoglobin 11.3 (L) 12.0 - 15.0 g/dL   HCT 36.7 36.0 - 46.0 %   MCV 90.6 78.0 - 100.0 fL   MCH 27.9 26.0 - 34.0 pg   MCHC 30.8 30.0 - 36.0 g/dL    RDW 13.4 11.5 - 15.5 %   Platelets 254 150 - 400 K/uL   Neutrophils Relative % 71 %   Neutro Abs 7.7 1.7 - 7.7 K/uL   Lymphocytes Relative 19 %   Lymphs Abs 2.1 0.7 - 4.0 K/uL   Monocytes Relative 7 %   Monocytes Absolute 0.7 0.1 - 1.0 K/uL   Eosinophils Relative 2 %   Eosinophils Absolute 0.3 0.0 - 0.7 K/uL   Basophils Relative 1 %   Basophils Absolute 0.1 0.0 - 0.1 K/uL   Immature Granulocytes 0 %   Abs Immature Granulocytes 0.0 0.0 - 0.1 K/uL  Protime-INR  Result Value Ref Range   Prothrombin Time 13.1 11.4 - 15.2 seconds   INR 1.00   Type and screen [place]  Result Value Ref Range   ABO/RH(D) O NEG    Antibody Screen NEG    Sample Expiration 01/21/2018    Extend sample reason      NO TRANSFUSIONS OR PREGNANCY IN THE PAST 3 MONTHS Performed at [facility], [address] [place], [place] [address]   ABO/Rh  Result Value Ref Range   ABO/RH(D)      O NEG Performed at [facility] [address] [place], [place] [address]     Chest 2 View  Result  Date: 01/07/2018 CLINICAL DATA:  Pre-op respiratory exam for spinal fusion. Spondylolisthesis. EXAM: CHEST - 2 VIEW COMPARISON:  06/02/2013 FINDINGS: The heart size and mediastinal contours are within normal limits. Both lungs are clear. The visualized skeletal structures are unremarkable. IMPRESSION: No active cardiopulmonary disease. Electronically Signed   By: Earle Gell M.D.   On: 01/07/2018 15:29   Dg Lumbar Spine 2-3 Views  Result Date: 01/15/2018 CLINICAL DATA:  Lumbar fusion EXAM: DG C-ARM 61-120 MIN; LUMBAR SPINE - 2-3 VIEW COMPARISON:  None. FLUOROSCOPY TIME:  Radiation Exposure Index (as provided by the fluoroscopic device): Not available If the device does not provide the exposure index: Fluoroscopy Time:  1 minutes 40 seconds Number of Acquired Images:  3 FINDINGS: Pedicle screws are noted at L4, L5 and S1 with posterior fixation. No other focal abnormality is seen. IMPRESSION: L4-5  and L5-S1 fusion. Electronically Signed   By: Inez Catalina M.D.   On: 01/15/2018 12:25   Dg C-arm 1-60 Min  Result Date: 01/15/2018 CLINICAL DATA:  Lumbar fusion EXAM: DG C-ARM 61-120 MIN; LUMBAR SPINE - 2-3 VIEW COMPARISON:  None. FLUOROSCOPY TIME:  Radiation Exposure Index (as provided by the fluoroscopic device): Not available If the device does not provide the exposure index: Fluoroscopy Time:  1 minutes 40 seconds Number of Acquired Images:  3 FINDINGS: Pedicle screws are noted at L4, L5 and S1 with posterior fixation. No other focal abnormality is seen. IMPRESSION: L4-5 and L5-S1 fusion. Electronically Signed   By: Inez Catalina M.D.   On: 01/15/2018 12:25   Dg C-arm 1-60 Min  Result Date: 01/15/2018 CLINICAL DATA:  Lumbar fusion EXAM: DG C-ARM 61-120 MIN; LUMBAR SPINE - 2-3 VIEW COMPARISON:  None. FLUOROSCOPY TIME:  Radiation Exposure Index (as provided by the fluoroscopic device): Not available If the device does not provide the exposure index: Fluoroscopy Time:  1 minutes 40 seconds Number of Acquired Images:  3 FINDINGS: Pedicle screws are noted at L4, L5 and S1 with posterior fixation. No other focal abnormality is seen. IMPRESSION: L4-5 and L5-S1 fusion. Electronically Signed   By: Inez Catalina M.D.   On: 01/15/2018 12:25    Antibiotics:  Anti-infectives (From admission, onward)   Start     Dose/Rate Route Frequency Ordered Stop   01/15/18 1700  ceFAZolin (ANCEF) IVPB 2g/100 mL premix     2 g 200 mL/hr over 30 Minutes Intravenous Every 8 hours 01/15/18 1417 01/16/18 0102   01/15/18 1211  vancomycin (VANCOCIN) powder  Status:  Discontinued       As needed 01/15/18 1211 01/15/18 1244   01/15/18 0928  bacitracin 50,000 Units in sodium chloride 0.9 % 500 mL irrigation  Status:  Discontinued       As needed 01/15/18 0928 01/15/18 1244   01/15/18 0600  ceFAZolin (ANCEF) IVPB 2g/100 mL premix     2 g 200 mL/hr over 30 Minutes Intravenous On call to O.R. 01/15/18 0544 01/15/18 0907       Discharge Exam: Blood pressure (!) 99/54, pulse 66, temperature 98.8 F (37.1 C), temperature source Oral, resp. rate 16, height 5\' 2"  (1.575 m), weight 62.1 kg (137 lb), SpO2 97 %. Neurologic: Grossly normal Ambulating and voiding well  Discharge Medications:   Allergies as of 01/16/2018   No Known Allergies     Medication List    TAKE these medications   ALPRAZolam 1 MG tablet Commonly known as:  XANAX Take 1 mg by mouth 3 (three) times daily as needed for  anxiety. Patient will sometimes take 0.5mg -1mg    buPROPion 150 MG 24 hr tablet Commonly known as:  WELLBUTRIN XL Take 300 mg by mouth daily.   CALCIUM PO Take 1 tablet by mouth daily.   diclofenac 50 MG tablet Commonly known as:  CATAFLAM Take 1 tablet (50 mg total) by mouth 3 (three) times daily.   gabapentin 300 MG capsule Commonly known as:  NEURONTIN Take 300 mg by mouth at bedtime.   HYDROcodone-acetaminophen 10-325 MG tablet Commonly known as:  NORCO Take 1 tablet by mouth every 4 (four) hours as needed for moderate pain.   loperamide 2 MG tablet Commonly known as:  IMODIUM A-D Take 2-4 mg by mouth 4 (four) times daily as needed for diarrhea or loose stools.   loratadine 10 MG tablet Commonly known as:  CLARITIN Take 10 mg by mouth daily as needed for allergies.   Melatonin 3 MG Caps Take 3 mg by mouth at bedtime as needed (for sleep).   methocarbamol 500 MG tablet Commonly known as:  ROBAXIN Take 1 tablet (500 mg total) by mouth every 6 (six) hours as needed for muscle spasms.   metoprolol succinate 25 MG 24 hr tablet Commonly known as:  TOPROL-XL Take 25 mg by mouth at bedtime.   rosuvastatin 10 MG tablet Commonly known as:  CRESTOR Take 10 mg by mouth at bedtime.   sertraline 100 MG tablet Commonly known as:  ZOLOFT Take 100 mg by mouth at bedtime.   telmisartan 80 MG tablet Commonly known as:  MICARDIS Take 80 mg by mouth daily.   traZODone 100 MG tablet Commonly known as:   DESYREL Take 100 mg by mouth at bedtime.   VITAMIN B-12 PO Take 1 tablet by mouth daily.   Vitamin D3 1000 units Caps Take 3,000 Units by mouth daily.            Durable Medical Equipment  (From admission, onward)        Start     Ordered   01/15/18 1418  DME Walker rolling  Once    Question:  Patient needs a walker to treat with the following condition  Answer:  S/P lumbar fusion   01/15/18 1417   01/15/18 1418  DME 3 n 1  Once     01/15/18 1417      Disposition: home  Final Dx: decompression and fusion L4-S1  Discharge Instructions    Call MD for:  difficulty breathing, headache or visual disturbances   Complete by:  As directed    Call MD for:  extreme fatigue   Complete by:  As directed    Call MD for:  hives   Complete by:  As directed    Call MD for:  persistant dizziness or light-headedness   Complete by:  As directed    Call MD for:  persistant nausea and vomiting   Complete by:  As directed    Call MD for:  redness, tenderness, or signs of infection (pain, swelling, redness, odor or green/yellow discharge around incision site)   Complete by:  As directed    Call MD for:  severe uncontrolled pain   Complete by:  As directed    Diet - low sodium heart healthy   Complete by:  As directed    Driving Restrictions   Complete by:  As directed    No driving 2 weeks   Increase activity slowly   Complete by:  As directed    Remove dressing in  48 hours   Complete by:  As directed          Signed: Ocie Cornfield Kiyani Jernigan 01/16/2018, 11:04 AM

## 2018-01-16 NOTE — Discharge Instructions (Signed)

## 2018-01-16 NOTE — Care Management Note (Signed)
Case Management Note  Patient Details  Name: Martha Higgins MRN: [id number] Date of Birth: [birth year]  Subjective/Objective:  72 yr old female S/p laminectomy L4-5 and L5-S1 and Posterior fixation L4-S1 on 01/15/18.                    Action/Plan: Case manager spoke with patient concerning discharge plan. Choice for Home health agency was offered, referral was called to [facility]. Patient will have family support at discharge.      Expected Discharge Date:  01/16/18               Expected Discharge Plan:  [place]  In-House Referral:  NA  Discharge planning Services  CM Consult  Post Acute Care Choice:  Home Health Choice offered to:  Patient  DME Arranged:  N/A DME Agency:  NA  HH Arranged:  PT, OT HH Agency:  [facility]  Status of Service:  Completed, signed off  If discussed at [place] of Stay Meetings, dates discussed:    Additional Comments:  Ninfa Meeker, RN 01/16/2018, 11:37 AM

## 2018-01-22 ENCOUNTER — Encounter (HOSPITAL_COMMUNITY): Payer: Self-pay | Admitting: Neurological Surgery

## 2018-02-25 DIAGNOSIS — M4316 Spondylolisthesis, lumbar region: Secondary | ICD-10-CM | POA: Diagnosis not present

## 2018-03-24 DIAGNOSIS — E782 Mixed hyperlipidemia: Secondary | ICD-10-CM | POA: Diagnosis not present

## 2018-03-28 DIAGNOSIS — I10 Essential (primary) hypertension: Secondary | ICD-10-CM | POA: Diagnosis not present

## 2018-03-28 DIAGNOSIS — E782 Mixed hyperlipidemia: Secondary | ICD-10-CM | POA: Diagnosis not present

## 2018-04-08 DIAGNOSIS — I10 Essential (primary) hypertension: Secondary | ICD-10-CM | POA: Diagnosis not present

## 2018-04-08 DIAGNOSIS — M4316 Spondylolisthesis, lumbar region: Secondary | ICD-10-CM | POA: Diagnosis not present

## 2018-05-27 DIAGNOSIS — A09 Infectious gastroenteritis and colitis, unspecified: Secondary | ICD-10-CM | POA: Diagnosis not present

## 2018-05-27 DIAGNOSIS — Z23 Encounter for immunization: Secondary | ICD-10-CM | POA: Diagnosis not present

## 2018-06-10 DIAGNOSIS — M4316 Spondylolisthesis, lumbar region: Secondary | ICD-10-CM | POA: Diagnosis not present

## 2018-06-30 ENCOUNTER — Other Ambulatory Visit: Payer: Self-pay | Admitting: Gastroenterology

## 2018-06-30 DIAGNOSIS — R634 Abnormal weight loss: Secondary | ICD-10-CM | POA: Diagnosis not present

## 2018-06-30 DIAGNOSIS — A09 Infectious gastroenteritis and colitis, unspecified: Secondary | ICD-10-CM | POA: Diagnosis not present

## 2018-06-30 DIAGNOSIS — K529 Noninfective gastroenteritis and colitis, unspecified: Secondary | ICD-10-CM

## 2018-07-01 ENCOUNTER — Ambulatory Visit
Admission: RE | Admit: 2018-07-01 | Discharge: 2018-07-01 | Disposition: A | Discharge status: Home / Self Care | Payer: Medicare Other | Source: Ambulatory Visit | Attending: Gastroenterology | Admitting: Gastroenterology

## 2018-07-01 DIAGNOSIS — R1084 Generalized abdominal pain: Secondary | ICD-10-CM | POA: Diagnosis not present

## 2018-07-01 DIAGNOSIS — K529 Noninfective gastroenteritis and colitis, unspecified: Secondary | ICD-10-CM

## 2018-07-01 DIAGNOSIS — R634 Abnormal weight loss: Secondary | ICD-10-CM

## 2018-07-01 MED ORDER — IOHEXOL 300 MG/ML  SOLN
100.0000 mL | Freq: Once | INTRAMUSCULAR | Status: AC | PRN
  Administered 2018-07-01: 100 mL via INTRAVENOUS

## 2018-07-09 DIAGNOSIS — I119 Hypertensive heart disease without heart failure: Secondary | ICD-10-CM | POA: Diagnosis not present

## 2018-07-09 DIAGNOSIS — I10 Essential (primary) hypertension: Secondary | ICD-10-CM | POA: Diagnosis not present

## 2018-07-09 DIAGNOSIS — E7800 Pure hypercholesterolemia, unspecified: Secondary | ICD-10-CM | POA: Diagnosis not present

## 2018-07-09 DIAGNOSIS — N390 Urinary tract infection, site not specified: Secondary | ICD-10-CM | POA: Diagnosis not present

## 2018-07-16 DIAGNOSIS — E46 Unspecified protein-calorie malnutrition: Secondary | ICD-10-CM | POA: Diagnosis not present

## 2018-07-16 DIAGNOSIS — D649 Anemia, unspecified: Secondary | ICD-10-CM | POA: Diagnosis not present

## 2018-07-16 DIAGNOSIS — R197 Diarrhea, unspecified: Secondary | ICD-10-CM | POA: Diagnosis not present

## 2018-07-16 DIAGNOSIS — F418 Other specified anxiety disorders: Secondary | ICD-10-CM | POA: Diagnosis not present

## 2018-07-16 DIAGNOSIS — I119 Hypertensive heart disease without heart failure: Secondary | ICD-10-CM | POA: Diagnosis not present

## 2018-07-16 DIAGNOSIS — R634 Abnormal weight loss: Secondary | ICD-10-CM | POA: Diagnosis not present

## 2018-07-17 DIAGNOSIS — D649 Anemia, unspecified: Secondary | ICD-10-CM | POA: Diagnosis not present

## 2018-07-17 DIAGNOSIS — I10 Essential (primary) hypertension: Secondary | ICD-10-CM | POA: Diagnosis not present

## 2018-07-17 DIAGNOSIS — Z23 Encounter for immunization: Secondary | ICD-10-CM | POA: Diagnosis not present

## 2018-07-17 DIAGNOSIS — R197 Diarrhea, unspecified: Secondary | ICD-10-CM | POA: Diagnosis not present

## 2018-07-17 DIAGNOSIS — R634 Abnormal weight loss: Secondary | ICD-10-CM | POA: Diagnosis not present

## 2018-07-21 DIAGNOSIS — R197 Diarrhea, unspecified: Secondary | ICD-10-CM | POA: Diagnosis not present

## 2018-08-04 ENCOUNTER — Telehealth: Payer: Self-pay | Admitting: Hematology and Oncology

## 2018-08-04 DIAGNOSIS — M4316 Spondylolisthesis, lumbar region: Secondary | ICD-10-CM | POA: Diagnosis not present

## 2018-08-04 DIAGNOSIS — I10 Essential (primary) hypertension: Secondary | ICD-10-CM | POA: Diagnosis not present

## 2018-08-04 NOTE — Telephone Encounter (Signed)
Received a new hem referral from Dr. [name] for anemia. Pt has been cld and scheduled to see Dr. Alvy Bimler on 1/16 at 1015am. Pt aware to arrive 30 minutes early. Letter mailed.

## 2018-08-14 ENCOUNTER — Inpatient Hospital Stay: Payer: Medicare Other

## 2018-08-14 ENCOUNTER — Inpatient Hospital Stay: Payer: Medicare Other | Attending: Hematology and Oncology | Admitting: Hematology and Oncology

## 2018-08-14 ENCOUNTER — Encounter: Payer: Self-pay | Admitting: Hematology and Oncology

## 2018-08-14 ENCOUNTER — Telehealth: Payer: Self-pay | Admitting: Hematology and Oncology

## 2018-08-14 DIAGNOSIS — R5383 Other fatigue: Secondary | ICD-10-CM | POA: Insufficient documentation

## 2018-08-14 DIAGNOSIS — E538 Deficiency of other specified B group vitamins: Secondary | ICD-10-CM | POA: Diagnosis not present

## 2018-08-14 DIAGNOSIS — N1831 Chronic kidney disease, stage 3a: Secondary | ICD-10-CM | POA: Insufficient documentation

## 2018-08-14 DIAGNOSIS — Z90710 Acquired absence of both cervix and uterus: Secondary | ICD-10-CM | POA: Insufficient documentation

## 2018-08-14 DIAGNOSIS — N183 Chronic kidney disease, stage 3 unspecified: Secondary | ICD-10-CM

## 2018-08-14 DIAGNOSIS — R197 Diarrhea, unspecified: Secondary | ICD-10-CM | POA: Diagnosis not present

## 2018-08-14 DIAGNOSIS — I10 Essential (primary) hypertension: Secondary | ICD-10-CM | POA: Insufficient documentation

## 2018-08-14 DIAGNOSIS — D649 Anemia, unspecified: Secondary | ICD-10-CM | POA: Insufficient documentation

## 2018-08-14 DIAGNOSIS — G8929 Other chronic pain: Secondary | ICD-10-CM | POA: Diagnosis not present

## 2018-08-14 DIAGNOSIS — M549 Dorsalgia, unspecified: Secondary | ICD-10-CM | POA: Insufficient documentation

## 2018-08-14 DIAGNOSIS — Z79899 Other long term (current) drug therapy: Secondary | ICD-10-CM | POA: Insufficient documentation

## 2018-08-14 LAB — CBC WITH DIFFERENTIAL/PLATELET
Abs Immature Granulocytes: 0.02 10*3/uL (ref 0.00–0.07)
BASOS ABS: 0 10*3/uL (ref 0.0–0.1)
Basophils Relative: 1 %
Eosinophils Absolute: 0.1 10*3/uL (ref 0.0–0.5)
Eosinophils Relative: 2 %
HEMATOCRIT: 35.2 % — AB (ref 36.0–46.0)
Hemoglobin: 10.9 g/dL — ABNORMAL LOW (ref 12.0–15.0)
Immature Granulocytes: 0 %
LYMPHS ABS: 1.7 10*3/uL (ref 0.7–4.0)
LYMPHS PCT: 23 %
MCH: 28.1 pg (ref 26.0–34.0)
MCHC: 31 g/dL (ref 30.0–36.0)
MCV: 90.7 fL (ref 80.0–100.0)
Monocytes Absolute: 0.4 10*3/uL (ref 0.1–1.0)
Monocytes Relative: 6 %
NEUTROS PCT: 68 %
NRBC: 0 % (ref 0.0–0.2)
Neutro Abs: 5.3 10*3/uL (ref 1.7–7.7)
Platelets: 289 10*3/uL (ref 150–400)
RBC: 3.88 MIL/uL (ref 3.87–5.11)
RDW: 14.1 % (ref 11.5–15.5)
WBC: 7.7 10*3/uL (ref 4.0–10.5)

## 2018-08-14 LAB — COMPREHENSIVE METABOLIC PANEL
ALT: 11 U/L (ref 0–44)
ANION GAP: 7 (ref 5–15)
AST: 17 U/L (ref 15–41)
Albumin: 4 g/dL (ref 3.5–5.0)
Alkaline Phosphatase: 70 U/L (ref 38–126)
BUN: 16 mg/dL (ref 8–23)
CHLORIDE: 108 mmol/L (ref 98–111)
CO2: 27 mmol/L (ref 22–32)
Calcium: 9.9 mg/dL (ref 8.9–10.3)
Creatinine, Ser: 1.21 mg/dL — ABNORMAL HIGH (ref 0.44–1.00)
GFR calc non Af Amer: 44 mL/min — ABNORMAL LOW (ref >60)
GFR, EST AFRICAN AMERICAN: 51 mL/min — AB (ref >60)
Glucose, Bld: 94 mg/dL (ref 70–99)
POTASSIUM: 4.1 mmol/L (ref 3.5–5.1)
SODIUM: 142 mmol/L (ref 135–145)
Total Bilirubin: 0.3 mg/dL (ref 0.3–1.2)
Total Protein: 6.9 g/dL (ref 6.5–8.1)

## 2018-08-14 LAB — LACTATE DEHYDROGENASE: LDH: <90 U/L — ABNORMAL LOW (ref 98–192)

## 2018-08-14 LAB — IRON AND TIBC
Iron: 20 ug/dL — ABNORMAL LOW (ref 41–142)
Saturation Ratios: 8 % — ABNORMAL LOW (ref 21–57)
TIBC: 266 ug/dL (ref 236–444)
UIBC: 246 ug/dL (ref 120–384)

## 2018-08-14 LAB — TSH: TSH: 0.763 u[IU]/mL (ref 0.308–3.960)

## 2018-08-14 LAB — SEDIMENTATION RATE: SED RATE: 20 mm/h (ref 0–22)

## 2018-08-14 LAB — T4, FREE: FREE T4: 0.7 ng/dL — AB (ref 0.82–1.77)

## 2018-08-14 LAB — VITAMIN B12: VITAMIN B 12: 599 pg/mL (ref 180–914)

## 2018-08-14 NOTE — Assessment & Plan Note (Signed)
Likely related to aging and other risk factors such as hypertension.  She is not symptomatic.  Continue risk factor modification

## 2018-08-14 NOTE — Assessment & Plan Note (Signed)
The patient have history of borderline vitamin B12 and is taking vitamin B12 replacement therapy.  I will recheck her blood work today

## 2018-08-14 NOTE — Telephone Encounter (Signed)
Gave avs and calendar ° °

## 2018-08-14 NOTE — Assessment & Plan Note (Signed)
She had recent mild hypercalcemia We will recheck it again I will check a vitamin D level especially in view of history of fractures and will check a myeloma panel to exclude bone marrow disorder as a cause of her anemia, hypercalcemia and renal insufficiency

## 2018-08-14 NOTE — Assessment & Plan Note (Signed)
She complained of feeling cold all the time and have recent weight change and fatigue That cannot be explained with a mild anemia seen I will order thyroid function test for further evaluation
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
## 2018-08-14 NOTE — Assessment & Plan Note (Signed)
This is likely anemia of chronic disease, likely due to borderline renal function. The patient denies recent history of bleeding such as epistaxis, hematuria or hematochezia. She is asymptomatic from the anemia. We will observe for now.  She does not require transfusion now. We discussed further work-up.
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
## 2018-08-15 LAB — VITAMIN D 25 HYDROXY (VIT D DEFICIENCY, FRACTURES): Vit D, 25-Hydroxy: 52.5 ng/mL (ref 30.0–100.0)

## 2018-08-15 LAB — ERYTHROPOIETIN: Erythropoietin: 7 m[IU]/mL (ref 2.6–18.5)

## 2018-08-15 LAB — KAPPA/LAMBDA LIGHT CHAINS
KAPPA FREE LGHT CHN: 17.1 mg/L (ref 3.3–19.4)
Kappa, lambda light chain ratio: 0.7 (ref 0.26–1.65)
Lambda free light chains: 24.5 mg/L (ref 5.7–26.3)

## 2018-08-15 LAB — FOLATE RBC
FOLATE, HEMOLYSATE: 345 ng/mL
FOLATE, RBC: 1033 ng/mL (ref >498)
HEMATOCRIT: 33.4 % — AB (ref 34.0–46.6)

## 2018-08-18 LAB — MULTIPLE MYELOMA PANEL, SERUM
ALPHA2 GLOB SERPL ELPH-MCNC: 0.8 g/dL (ref 0.4–1.0)
Albumin SerPl Elph-Mcnc: 3.9 g/dL (ref 2.9–4.4)
Albumin/Glob SerPl: 1.6 (ref 0.7–1.7)
Alpha 1: 0.3 g/dL (ref 0.0–0.4)
B-Globulin SerPl Elph-Mcnc: 0.8 g/dL (ref 0.7–1.3)
Gamma Glob SerPl Elph-Mcnc: 0.7 g/dL (ref 0.4–1.8)
Globulin, Total: 2.5 g/dL (ref 2.2–3.9)
IGG (IMMUNOGLOBIN G), SERUM: 741 mg/dL (ref 700–1600)
IgA: 77 mg/dL (ref 64–422)
IgM (Immunoglobulin M), Srm: 80 mg/dL (ref 26–217)
TOTAL PROTEIN ELP: 6.4 g/dL (ref 6.0–8.5)

## 2018-08-21 ENCOUNTER — Inpatient Hospital Stay (HOSPITAL_BASED_OUTPATIENT_CLINIC_OR_DEPARTMENT_OTHER): Payer: Medicare Other | Admitting: Hematology and Oncology

## 2018-08-21 DIAGNOSIS — Z90710 Acquired absence of both cervix and uterus: Secondary | ICD-10-CM | POA: Diagnosis not present

## 2018-08-21 DIAGNOSIS — M549 Dorsalgia, unspecified: Secondary | ICD-10-CM

## 2018-08-21 DIAGNOSIS — D500 Iron deficiency anemia secondary to blood loss (chronic): Secondary | ICD-10-CM

## 2018-08-21 DIAGNOSIS — G8929 Other chronic pain: Secondary | ICD-10-CM | POA: Diagnosis not present

## 2018-08-21 DIAGNOSIS — K52839 Microscopic colitis, unspecified: Secondary | ICD-10-CM

## 2018-08-21 DIAGNOSIS — N183 Chronic kidney disease, stage 3 unspecified: Secondary | ICD-10-CM

## 2018-08-21 DIAGNOSIS — E538 Deficiency of other specified B group vitamins: Secondary | ICD-10-CM

## 2018-08-21 DIAGNOSIS — Z79899 Other long term (current) drug therapy: Secondary | ICD-10-CM

## 2018-08-21 DIAGNOSIS — R197 Diarrhea, unspecified: Secondary | ICD-10-CM

## 2018-08-21 DIAGNOSIS — D649 Anemia, unspecified: Secondary | ICD-10-CM | POA: Diagnosis not present

## 2018-08-21 DIAGNOSIS — I10 Essential (primary) hypertension: Secondary | ICD-10-CM

## 2018-08-22 ENCOUNTER — Encounter: Payer: Self-pay | Admitting: Hematology and Oncology

## 2018-08-22 DIAGNOSIS — D500 Iron deficiency anemia secondary to blood loss (chronic): Secondary | ICD-10-CM | POA: Insufficient documentation

## 2018-08-22 DIAGNOSIS — K52839 Microscopic colitis, unspecified: Secondary | ICD-10-CM | POA: Insufficient documentation

## 2018-08-22 NOTE — Assessment & Plan Note (Signed)
We discussed iron rich diet.

## 2018-08-22 NOTE — Assessment & Plan Note (Signed)
She is waiting for second opinion/evaluation at [facility] I suspect this is the most likely cause of her iron deficiency.

## 2018-08-22 NOTE — Progress Notes (Signed)
[place] OFFICE PROGRESS NOTE  [name], MD  ASSESSMENT & PLAN:  Mild anemia After extensive evaluation, the most likely cause of her anemia is mild, borderline iron deficiency (related to her history of colitis) and related to mild chronic kidney disease stage III She is not symptomatic I recommend a trial of increasing iron rich diet. Since she will be undergoing further GI evaluation, I do not recommend iron supplementation yet. We discussed follow-up.  She is interested to follow-up with her primary care doctor first but she is aware that she can call me in the future if she desire intravenous iron replacement therapy  CKD (chronic kidney disease), stage III ([place]) This is likely related to chronic hypertension and aging We discussed risk factor modification  Microscopic colitis, unspecified She is waiting for second opinion/evaluation at [facility] I suspect this is the most likely cause of her iron deficiency.  Iron deficiency anemia due to chronic blood loss We discussed iron rich diet.   No orders of the defined types were placed in this encounter. I spent total 15 minutes reviewing all test results and addressed all her questions  INTERVAL HISTORY: Martha Higgins 73 y.o. female returns for further follow-up concerning evaluation for anemia. Since last time I saw her, she feels well  SUMMARY OF HEMATOLOGIC HISTORY: Martha Higgins 73 y.o. female is here because of recent findings of mild anemia.  I have the opportunity to review outside records amounting to 28 pages and her internal electronic record.  Summary of her blood count monitoring is as follows:  1) June 19, 2016 hemoglobin 11.9 2) July 01, 2017 hemoglobin 12.5, creatinine 1.1 3) 01/07/2018, hemoglobin 11.3 4) July 09, 2018, hemoglobin 11.1, MCV 88.9, platelet count 343, white blood cell count 7.4, creatinine 1.3 and iron saturation 15%  She was found to have abnormal  CBC from routine blood count monitoring She denies recent chest pain on exertion, shortness of breath on minimal exertion, pre-syncopal episodes, or palpitations. She had not noticed any recent bleeding such as epistaxis, hematuria or hematochezia The patient denies over the counter NSAID ingestion. She is not on antiplatelets agents. Her last colonoscopy was several years ago and showed microscopic colitis She had no prior history or diagnosis of cancer. Her age appropriate screening programs are up-to-date. She denies any pica and eats a variety of diet. She donated blood more than 5 years ago but never received blood transfusion  She had history of orthopedic surgery for chronic back pain secondary to injuries in the past She has chronic intermittent diarrhea due to microscopic colitis and is currently being referred to see GI specialist at [facility]  On August 14, 2018, she underwent extensive evaluation which concluded borderline iron deficiency, chronic renal failure and excluded multiple myeloma and other causes of anemia.  I have reviewed the past medical history, past surgical history, social history and family history with the patient and they are unchanged from previous note.  ALLERGIES:  has No Known Allergies.  MEDICATIONS:  Current Outpatient Medications  Medication Sig Dispense Refill  . ALPRAZolam (XANAX) 1 MG tablet Take 1 mg by mouth 3 (three) times daily as needed for anxiety. Patient will sometimes take 0.70m-1mg    . buPROPion (WELLBUTRIN XL) 150 MG 24 hr tablet Take 300 mg by mouth daily.    .Marland KitchenCALCIUM PO Take 1 tablet by mouth daily.    . Cholecalciferol (VITAMIN D3) 1000 units CAPS Take 3,000 Units by  mouth daily.    . Cyanocobalamin (VITAMIN B-12 PO) Take 1 tablet by mouth daily.    Marland Kitchen gabapentin (NEURONTIN) 300 MG capsule Take 300 mg by mouth at bedtime.    Marland Kitchen loperamide (IMODIUM A-D) 2 MG tablet Take 2-4 mg by mouth 4 (four) times daily as needed for diarrhea  or loose stools.    Marland Kitchen loratadine (CLARITIN) 10 MG tablet Take 10 mg by mouth daily as needed for allergies.    . Melatonin 3 MG CAPS Take 3 mg by mouth at bedtime as needed (for sleep).    . metoprolol succinate (TOPROL-XL) 25 MG 24 hr tablet Take 25 mg by mouth at bedtime.  3  . rosuvastatin (CRESTOR) 10 MG tablet Take 10 mg by mouth at bedtime.    . sertraline (ZOLOFT) 100 MG tablet Take 100 mg by mouth at bedtime.    Marland Kitchen telmisartan (MICARDIS) 80 MG tablet Take 80 mg by mouth daily.    . traZODone (DESYREL) 100 MG tablet Take 100 mg by mouth at bedtime.     No current facility-administered medications for this visit.      REVIEW OF SYSTEMS:   Constitutional: Denies fevers, chills or night sweats Eyes: Denies blurriness of vision Ears, nose, mouth, throat, and face: Denies mucositis or sore throat Respiratory: Denies cough, dyspnea or wheezes Cardiovascular: Denies palpitation, chest discomfort or lower extremity swelling Gastrointestinal:  Denies nausea, heartburn or change in bowel habits Skin: Denies abnormal skin rashes Lymphatics: Denies new lymphadenopathy or easy bruising Neurological:Denies numbness, tingling or new weaknesses Behavioral/Psych: Mood is stable, no new changes  All other systems were reviewed with the patient and are negative.  PHYSICAL EXAMINATION: ECOG PERFORMANCE STATUS: 0 - Asymptomatic  Vitals:   08/21/18 1041  BP: (!) 143/77  Pulse: 67  Resp: 18  Temp: 98.2 F (36.8 C)  SpO2: 100%   Filed Weights   08/21/18 1041  Weight: 113 lb 12.8 oz (51.6 kg)    GENERAL:alert, no distress and comfortable Musculoskeletal:no cyanosis of digits and no clubbing  NEURO: alert & oriented x 3 with fluent speech, no focal motor/sensory deficits  LABORATORY DATA:  I have reviewed the data as listed     Component Value Date/Time   NA 142 08/14/2018 1059   K 4.1 08/14/2018 1059   CL 108 08/14/2018 1059   CO2 27 08/14/2018 1059   GLUCOSE 94 08/14/2018 1059    BUN 16 08/14/2018 1059   CREATININE 1.21 (H) 08/14/2018 1059   CALCIUM 9.9 08/14/2018 1059   PROT 6.9 08/14/2018 1059   ALBUMIN 4.0 08/14/2018 1059   AST 17 08/14/2018 1059   ALT 11 08/14/2018 1059   ALKPHOS 70 08/14/2018 1059   BILITOT 0.3 08/14/2018 1059   GFRNONAA 44 (L) 08/14/2018 1059   GFRAA 51 (L) 08/14/2018 1059    No results found for: SPEP, UPEP  Lab Results  Component Value Date   WBC 7.7 08/14/2018   NEUTROABS 5.3 08/14/2018   HGB 10.9 (L) 08/14/2018   HCT 33.4 (L) 08/14/2018   MCV 90.7 08/14/2018   PLT 289 08/14/2018      Chemistry      Component Value Date/Time   NA 142 08/14/2018 1059   K 4.1 08/14/2018 1059   CL 108 08/14/2018 1059   CO2 27 08/14/2018 1059   BUN 16 08/14/2018 1059   CREATININE 1.21 (H) 08/14/2018 1059      Component Value Date/Time   CALCIUM 9.9 08/14/2018 1059   ALKPHOS 70 08/14/2018  1059   AST 17 08/14/2018 1059   ALT 11 08/14/2018 1059   BILITOT 0.3 08/14/2018 1059      I spent 15 minutes counseling the patient face to face. The total time spent in the appointment was 20 minutes and more than 50% was on counseling.   All questions were answered. The patient knows to call the clinic with any problems, questions or concerns. No barriers to learning was detected.    Heath Lark, MD 1/24/202011:07 AM

## 2018-08-22 NOTE — Assessment & Plan Note (Signed)
This is likely related to chronic hypertension and aging We discussed risk factor modification

## 2018-08-22 NOTE — Assessment & Plan Note (Signed)
After extensive evaluation, the most likely cause of her anemia is mild, borderline iron deficiency (related to her history of colitis) and related to mild chronic kidney disease stage III She is not symptomatic I recommend a trial of increasing iron rich diet. Since she will be undergoing further GI evaluation, I do not recommend iron supplementation yet. We discussed follow-up.  She is interested to follow-up with her primary care doctor first but she is aware that she can call me in the future if she desire intravenous iron replacement therapy

## 2018-09-04 DIAGNOSIS — R197 Diarrhea, unspecified: Secondary | ICD-10-CM | POA: Diagnosis not present

## 2018-09-04 DIAGNOSIS — Z6820 Body mass index (BMI) 20.0-20.9, adult: Secondary | ICD-10-CM | POA: Diagnosis not present

## 2018-10-08 DIAGNOSIS — Z8639 Personal history of other endocrine, nutritional and metabolic disease: Secondary | ICD-10-CM | POA: Diagnosis not present

## 2018-10-08 DIAGNOSIS — Z8719 Personal history of other diseases of the digestive system: Secondary | ICD-10-CM | POA: Diagnosis not present

## 2018-10-08 DIAGNOSIS — Z79899 Other long term (current) drug therapy: Secondary | ICD-10-CM | POA: Diagnosis not present

## 2018-10-08 DIAGNOSIS — F418 Other specified anxiety disorders: Secondary | ICD-10-CM | POA: Diagnosis not present

## 2018-10-08 DIAGNOSIS — I10 Essential (primary) hypertension: Secondary | ICD-10-CM | POA: Diagnosis not present

## 2018-10-08 DIAGNOSIS — E7800 Pure hypercholesterolemia, unspecified: Secondary | ICD-10-CM | POA: Diagnosis not present

## 2018-10-08 DIAGNOSIS — D649 Anemia, unspecified: Secondary | ICD-10-CM | POA: Diagnosis not present

## 2018-11-05 DIAGNOSIS — F418 Other specified anxiety disorders: Secondary | ICD-10-CM | POA: Diagnosis not present

## 2018-11-05 DIAGNOSIS — M810 Age-related osteoporosis without current pathological fracture: Secondary | ICD-10-CM | POA: Diagnosis not present

## 2018-11-05 DIAGNOSIS — Z23 Encounter for immunization: Secondary | ICD-10-CM | POA: Diagnosis not present

## 2018-11-05 DIAGNOSIS — G8929 Other chronic pain: Secondary | ICD-10-CM | POA: Diagnosis not present

## 2018-12-04 DIAGNOSIS — N261 Atrophy of kidney (terminal): Secondary | ICD-10-CM | POA: Diagnosis not present

## 2018-12-04 DIAGNOSIS — I700 Atherosclerosis of aorta: Secondary | ICD-10-CM | POA: Diagnosis not present

## 2018-12-04 DIAGNOSIS — N183 Chronic kidney disease, stage 3 (moderate): Secondary | ICD-10-CM | POA: Diagnosis not present

## 2018-12-04 DIAGNOSIS — I129 Hypertensive chronic kidney disease with stage 1 through stage 4 chronic kidney disease, or unspecified chronic kidney disease: Secondary | ICD-10-CM | POA: Diagnosis not present

## 2018-12-08 ENCOUNTER — Other Ambulatory Visit: Payer: Self-pay | Admitting: Nephrology

## 2018-12-08 DIAGNOSIS — I129 Hypertensive chronic kidney disease with stage 1 through stage 4 chronic kidney disease, or unspecified chronic kidney disease: Secondary | ICD-10-CM

## 2018-12-08 DIAGNOSIS — N261 Atrophy of kidney (terminal): Secondary | ICD-10-CM

## 2018-12-08 DIAGNOSIS — N183 Chronic kidney disease, stage 3 unspecified: Secondary | ICD-10-CM

## 2018-12-08 DIAGNOSIS — Z1231 Encounter for screening mammogram for malignant neoplasm of breast: Secondary | ICD-10-CM

## 2018-12-11 ENCOUNTER — Ambulatory Visit
Admission: RE | Admit: 2018-12-11 | Discharge: 2018-12-11 | Disposition: A | Discharge status: Home / Self Care | Payer: Medicare Other | Source: Ambulatory Visit | Attending: Nephrology | Admitting: Nephrology

## 2018-12-11 DIAGNOSIS — N261 Atrophy of kidney (terminal): Secondary | ICD-10-CM | POA: Diagnosis not present

## 2018-12-11 DIAGNOSIS — I129 Hypertensive chronic kidney disease with stage 1 through stage 4 chronic kidney disease, or unspecified chronic kidney disease: Secondary | ICD-10-CM

## 2018-12-11 DIAGNOSIS — N183 Chronic kidney disease, stage 3 unspecified: Secondary | ICD-10-CM

## 2018-12-16 ENCOUNTER — Ambulatory Visit
Admission: RE | Admit: 2018-12-16 | Discharge: 2018-12-16 | Disposition: A | Discharge status: Home / Self Care | Payer: Medicare Other | Source: Ambulatory Visit | Attending: Nephrology | Admitting: Nephrology

## 2018-12-16 ENCOUNTER — Other Ambulatory Visit: Payer: Self-pay

## 2018-12-16 DIAGNOSIS — Z1231 Encounter for screening mammogram for malignant neoplasm of breast: Secondary | ICD-10-CM | POA: Diagnosis not present

## 2018-12-16 DIAGNOSIS — E875 Hyperkalemia: Secondary | ICD-10-CM | POA: Diagnosis not present

## 2018-12-29 DIAGNOSIS — N183 Chronic kidney disease, stage 3 unspecified: Secondary | ICD-10-CM

## 2018-12-29 HISTORY — DX: Chronic kidney disease, stage 3 unspecified: N18.30

## 2019-01-07 DIAGNOSIS — I10 Essential (primary) hypertension: Secondary | ICD-10-CM | POA: Diagnosis not present

## 2019-01-12 ENCOUNTER — Other Ambulatory Visit: Payer: Self-pay | Admitting: Cardiology

## 2019-01-21 DIAGNOSIS — E7800 Pure hypercholesterolemia, unspecified: Secondary | ICD-10-CM | POA: Diagnosis not present

## 2019-01-21 DIAGNOSIS — M810 Age-related osteoporosis without current pathological fracture: Secondary | ICD-10-CM | POA: Diagnosis not present

## 2019-01-21 DIAGNOSIS — I10 Essential (primary) hypertension: Secondary | ICD-10-CM | POA: Diagnosis not present

## 2019-01-21 DIAGNOSIS — N189 Chronic kidney disease, unspecified: Secondary | ICD-10-CM | POA: Diagnosis not present

## 2019-02-05 ENCOUNTER — Other Ambulatory Visit: Payer: Self-pay

## 2019-02-05 ENCOUNTER — Encounter: Payer: Medicare Other | Attending: Nephrology | Admitting: Dietician

## 2019-02-05 ENCOUNTER — Encounter: Payer: Self-pay | Admitting: Dietician

## 2019-02-05 DIAGNOSIS — N183 Chronic kidney disease, stage 3 unspecified: Secondary | ICD-10-CM

## 2019-02-05 NOTE — Patient Instructions (Addendum)
Resources: [facility]  Avoid foods that have Phos... in the list of ingredients. Continue to choose foods low in sodium.  Breakfast: Egg, fried leached potatoes, fruit Egg, toast, 1/2 cup regular grits (not instant), fruit Yogurt and fruit, toast with butter Belvita, fruit, 1/2 cup milk  Lunch or Dinner: 2 ounces meat, 1/2 cup white rice, 1/2-1 cup cooked low potassium vegetable, low potassium fruit if smaller portion of vegetable, 1 slice bread with butter or graham crackers or vanilla wafers  1 cup rice, 2 ounces chicken, 1 cup sauteed vegetables  1 slider size hamburger on a small roll, 1/2 cup roasted summer squash, 1/2 ear corn

## 2019-02-05 NOTE — Progress Notes (Signed)
Medical Nutrition Therapy:  Appt start time: 1600 end time:  1700.   Assessment:  Primary concerns today: Patient is here today alone.  Her primary concern is nutrition related to CKD. She has been meticulously journaling her intake and the potassium and sodium content of foods eaten daily.  History includes CKD stage 3 with GFR of 43 12/04/18, vitamin D deficiency, colitis, HLD, GERD, osteopenia, and anemia. Unexplained weight loss- 140 lbs to 108 lbs 02/2018-10/2018 after back surgery when colitis faired back up. Weight today 117 lbs at home. MD wants her less than 120 lbs.  She states that she feels best at 108 lbs but other state she does not look well.  Potassium 12/04/18 was 5.7 and decreased to 5.0 5/29.  Patient lives with her husband.  She does the shopping and cooking. She has 9 grandchildren and they visit often. Her husband has celiac disease.   She is retired.  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  She continues to temp there at times.    Preferred Learning Style:   No preference indicated   Learning Readiness:   Ready  Change in progress   MEDICATIONS: see list to include vitamin D   DIETARY INTAKE: 24-hr recall:  B ( AM): 1/2 cup apple juice, Belvita  Snk ( AM):  L ( PM): hamburger patty, hawaiian bun, cucumbers, peppers, grapes,  Snk ( PM): occasional grapes or apple, LF greek yogurt D ( PM): 1/2 grilled chicken breast, 1/4 baked potato, 1/2 cup baked beans, 1/3 small banana Snk ( PM):  Beverages: coffee with powdered cream and sugar, water, 1 cup milk per day, juice  Usual physical activity: helps with the lawn work, does all of her own household chores, walks a little Had a gym membership prior to covid.  Estimated energy needs: 1600-1800 calories 50 g protein  Progress Towards Goal(s):  In progress.   Nutritional Diagnosis:  NB-1.1 Food and nutrition-related knowledge deficit As related to CKD and nutrition.  As evidenced by diet hx  and patient report.    Intervention:  Nutrition education related to a diet for CKD.  Discussed importance of continuing to follow a low sodium diet and low potassium diet.  Instructed on guidelines for amounts of sodium and potassium per day.  Discussed to avoid products with Phosphorous or other phos... ingredients on the food label.  Discussed that phosphorous, sodium, potassium are all naturally occurring in foods.  Free phosphorous is more easily absorbed than bound phosphorous.  Discussed meat portion size and that plant protein is more easily used by the body.  Provided resources and basic meal planning tips.  Discussed that she did not need to count the sodium and potassium in foods but to use the basic guidelines given.  Resources: [facility]  Avoid foods that have Phos... in the list of ingredients. Continue to choose foods low in sodium.  Breakfast: -Egg, fried leached potatoes, toast with jelly, low potassium fruit -Egg, toast, 1/2 cup regular grits (not instant), low potassium fruit -Yogurt and fruit, toast with butter -Belvita, low potassium fruit fruit, 1/2 cup milk  Lunch or Dinner: -2 ounces meat, 1/2 cup white rice, 1/2-1 cup cooked low potassium vegetable, low potassium fruit if smaller portion of vegetable, 1 slice bread with butter or graham crackers or vanilla wafers  -1 cup rice, 2 ounces chicken, 1 cup sauteed vegetables  -1 slider size hamburger on a small roll, 1/2 cup roasted summer squash, 1/2 ear  corn  Teaching Method Utilized:  Visual Auditory  Handouts given during visit include:  Dish up a kidney friendly meal poster from [employer]  List of potassium content of foods  Low protein recipes  Copies of low potassium fruits and vegetable pages from the Choose a Meal Book  Barriers to learning/adherence to lifestyle change: none  Demonstrated degree of understanding via:  Teach Back   Monitoring/Evaluation:  Dietary intake, exercise, label  reading, and body weight prn.

## 2019-03-24 ENCOUNTER — Encounter: Payer: Self-pay | Admitting: Cardiology

## 2019-03-24 DIAGNOSIS — I10 Essential (primary) hypertension: Secondary | ICD-10-CM | POA: Insufficient documentation

## 2019-03-24 DIAGNOSIS — E782 Mixed hyperlipidemia: Secondary | ICD-10-CM | POA: Insufficient documentation
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
## 2019-03-25 ENCOUNTER — Encounter: Payer: Self-pay | Admitting: Cardiology

## 2019-03-25 ENCOUNTER — Telehealth (INDEPENDENT_AMBULATORY_CARE_PROVIDER_SITE_OTHER): Payer: Medicare Other | Admitting: Cardiology

## 2019-03-25 ENCOUNTER — Other Ambulatory Visit: Payer: Self-pay

## 2019-03-25 VITALS — BP 122/96 | HR 56 | Ht 62.0 in | Wt 113.0 lb

## 2019-03-25 DIAGNOSIS — E782 Mixed hyperlipidemia: Secondary | ICD-10-CM

## 2019-03-25 DIAGNOSIS — I10 Essential (primary) hypertension: Secondary | ICD-10-CM

## 2019-03-25 DIAGNOSIS — N183 Chronic kidney disease, stage 3 unspecified: Secondary | ICD-10-CM

## 2019-03-25 DIAGNOSIS — I129 Hypertensive chronic kidney disease with stage 1 through stage 4 chronic kidney disease, or unspecified chronic kidney disease: Secondary | ICD-10-CM

## 2019-03-25 MED ORDER — NITROGLYCERIN 0.4 MG SL SUBL: 0.4000 mg | SUBLINGUAL_TABLET | SUBLINGUAL | 3 refills | Status: DC | PRN

## 2019-03-25 MED ORDER — METOPROLOL SUCCINATE ER 25 MG PO TB24: 25.0000 mg | ORAL_TABLET | Freq: Every day | ORAL | 3 refills | Status: DC

## 2019-03-25 MED ORDER — ROSUVASTATIN CALCIUM 10 MG PO TABS: 10.0000 mg | ORAL_TABLET | Freq: Every day | ORAL | 3 refills | Status: DC

## 2019-03-27 ENCOUNTER — Other Ambulatory Visit (HOSPITAL_COMMUNITY): Payer: Self-pay | Admitting: Cardiology

## 2019-03-27 DIAGNOSIS — I10 Essential (primary) hypertension: Secondary | ICD-10-CM

## 2019-03-27 DIAGNOSIS — N183 Chronic kidney disease, stage 3 (moderate): Secondary | ICD-10-CM | POA: Diagnosis not present

## 2019-03-28 LAB — BASIC METABOLIC PANEL
BUN/Creatinine Ratio: 11 — ABNORMAL LOW (ref 12–28)
BUN: 12 mg/dL (ref 8–27)
CO2: 25 mmol/L (ref 20–29)
Calcium: 9.9 mg/dL (ref 8.7–10.3)
Chloride: 98 mmol/L (ref 96–106)
Creatinine, Ser: 1.13 mg/dL — ABNORMAL HIGH (ref 0.57–1.00)
GFR calc Af Amer: 56 mL/min/{1.73_m2} — ABNORMAL LOW (ref >59)
GFR calc non Af Amer: 48 mL/min/{1.73_m2} — ABNORMAL LOW (ref >59)
Glucose: 85 mg/dL (ref 65–99)
Potassium: 5.4 mmol/L — ABNORMAL HIGH (ref 3.5–5.2)
Sodium: 135 mmol/L (ref 134–144)

## 2019-03-30 MED ORDER — TELMISARTAN 80 MG PO TABS: 80.0000 mg | ORAL_TABLET | Freq: Every day | ORAL | 1 refills | Status: AC

## 2019-03-30 NOTE — Addendum Note (Signed)
Addended by: Nigel Mormon on: 03/30/2019 09:33 PM   Modules accepted: Orders

## 2019-03-31 NOTE — Progress Notes (Signed)
LVM with details.

## 2019-05-28 DIAGNOSIS — Z23 Encounter for immunization: Secondary | ICD-10-CM | POA: Diagnosis not present

## 2019-07-30 DIAGNOSIS — S92534A Nondisplaced fracture of distal phalanx of right lesser toe(s), initial encounter for closed fracture: Secondary | ICD-10-CM | POA: Diagnosis not present

## 2019-08-06 DIAGNOSIS — R230 Cyanosis: Secondary | ICD-10-CM | POA: Diagnosis not present

## 2019-08-06 DIAGNOSIS — M810 Age-related osteoporosis without current pathological fracture: Secondary | ICD-10-CM | POA: Diagnosis not present

## 2019-08-06 DIAGNOSIS — F5101 Primary insomnia: Secondary | ICD-10-CM | POA: Diagnosis not present

## 2019-08-06 DIAGNOSIS — E875 Hyperkalemia: Secondary | ICD-10-CM | POA: Diagnosis not present

## 2019-08-06 DIAGNOSIS — I10 Essential (primary) hypertension: Secondary | ICD-10-CM | POA: Diagnosis not present

## 2019-08-06 DIAGNOSIS — E7800 Pure hypercholesterolemia, unspecified: Secondary | ICD-10-CM | POA: Diagnosis not present

## 2019-08-06 DIAGNOSIS — F339 Major depressive disorder, recurrent, unspecified: Secondary | ICD-10-CM | POA: Diagnosis not present

## 2019-08-11 DIAGNOSIS — M79642 Pain in left hand: Secondary | ICD-10-CM | POA: Diagnosis not present

## 2019-08-11 DIAGNOSIS — M79643 Pain in unspecified hand: Secondary | ICD-10-CM | POA: Diagnosis not present

## 2019-08-11 DIAGNOSIS — I7300 Raynaud's syndrome without gangrene: Secondary | ICD-10-CM | POA: Diagnosis not present

## 2019-08-11 DIAGNOSIS — M1990 Unspecified osteoarthritis, unspecified site: Secondary | ICD-10-CM | POA: Diagnosis not present

## 2019-08-11 DIAGNOSIS — M064 Inflammatory polyarthropathy: Secondary | ICD-10-CM | POA: Diagnosis not present

## 2019-08-11 DIAGNOSIS — M19042 Primary osteoarthritis, left hand: Secondary | ICD-10-CM | POA: Diagnosis not present

## 2019-08-11 DIAGNOSIS — M549 Dorsalgia, unspecified: Secondary | ICD-10-CM | POA: Diagnosis not present

## 2019-08-11 DIAGNOSIS — M19041 Primary osteoarthritis, right hand: Secondary | ICD-10-CM | POA: Diagnosis not present

## 2019-08-11 DIAGNOSIS — M79641 Pain in right hand: Secondary | ICD-10-CM | POA: Diagnosis not present

## 2019-09-03 DIAGNOSIS — N1831 Chronic kidney disease, stage 3a: Secondary | ICD-10-CM | POA: Diagnosis not present

## 2019-09-03 DIAGNOSIS — M542 Cervicalgia: Secondary | ICD-10-CM | POA: Diagnosis not present

## 2019-09-03 DIAGNOSIS — F418 Other specified anxiety disorders: Secondary | ICD-10-CM | POA: Diagnosis not present

## 2019-09-08 DIAGNOSIS — S92534A Nondisplaced fracture of distal phalanx of right lesser toe(s), initial encounter for closed fracture: Secondary | ICD-10-CM | POA: Diagnosis not present

## 2019-09-08 DIAGNOSIS — M79671 Pain in right foot: Secondary | ICD-10-CM | POA: Diagnosis not present

## 2019-09-10 DIAGNOSIS — M810 Age-related osteoporosis without current pathological fracture: Secondary | ICD-10-CM | POA: Diagnosis not present

## 2019-09-10 DIAGNOSIS — F339 Major depressive disorder, recurrent, unspecified: Secondary | ICD-10-CM | POA: Diagnosis not present

## 2019-09-24 DIAGNOSIS — N1831 Chronic kidney disease, stage 3a: Secondary | ICD-10-CM | POA: Diagnosis not present

## 2019-10-07 DIAGNOSIS — I10 Essential (primary) hypertension: Secondary | ICD-10-CM | POA: Diagnosis not present

## 2019-10-07 DIAGNOSIS — M810 Age-related osteoporosis without current pathological fracture: Secondary | ICD-10-CM | POA: Diagnosis not present

## 2019-10-07 DIAGNOSIS — F418 Other specified anxiety disorders: Secondary | ICD-10-CM | POA: Diagnosis not present

## 2019-10-07 DIAGNOSIS — F339 Major depressive disorder, recurrent, unspecified: Secondary | ICD-10-CM | POA: Diagnosis not present

## 2019-10-08 ENCOUNTER — Other Ambulatory Visit: Payer: Self-pay

## 2019-10-08 ENCOUNTER — Ambulatory Visit: Payer: Medicare Other | Admitting: Cardiology

## 2019-10-08 ENCOUNTER — Encounter: Payer: Self-pay | Admitting: Cardiology

## 2019-10-08 VITALS — BP 100/66 | HR 78 | Temp 97.3°F | Ht 62.0 in | Wt 109.0 lb

## 2019-10-08 DIAGNOSIS — N1831 Chronic kidney disease, stage 3a: Secondary | ICD-10-CM | POA: Diagnosis not present

## 2019-10-08 DIAGNOSIS — I10 Essential (primary) hypertension: Secondary | ICD-10-CM

## 2019-10-08 DIAGNOSIS — E782 Mixed hyperlipidemia: Secondary | ICD-10-CM | POA: Diagnosis not present

## 2019-10-08 NOTE — Progress Notes (Signed)
Follow up visit  Subjective:   Martha Higgins, female    DOB: Jan 13, 1946, 74 y.o.   MRN: [id number]    Chief Complaint  Patient presents with  . Hypertension  . Loss of Consciousness  . Follow-up    6 month    74 y.o. Caucasian female with hypertension, CKD 3, anxiety, depression, degenerative disc disease, severe Vit D deficiency.  Patient is doing well without any overt symptoms. She has lost a lot of weight with diet changes. Blood pressure is well controlled.    Current Outpatient Medications on File Prior to Visit  Medication Sig Dispense Refill  . ALPRAZolam (XANAX) 1 MG tablet Take 1 mg by mouth 3 (three) times daily as needed for anxiety. Patient will sometimes take 0.24m-1mg    . buPROPion (WELLBUTRIN XL) 150 MG 24 hr tablet Take 300 mg by mouth daily.    .Marland KitchenCALCIUM PO Take 1 tablet by mouth daily.    . Cholecalciferol (VITAMIN D3) 1000 units CAPS Take 3,000 Units by mouth daily.    . Cyanocobalamin (VITAMIN B-12 PO) Take 1 tablet by mouth daily.    . cyclobenzaprine (FLEXERIL) 10 MG tablet Take 10 mg by mouth at bedtime.    . gabapentin (NEURONTIN) 300 MG capsule Take 300 mg by mouth at bedtime.    .Marland Kitchenloperamide (IMODIUM A-D) 2 MG tablet Take 2-4 mg by mouth 4 (four) times daily as needed for diarrhea or loose stools.    .Marland Kitchenloratadine (CLARITIN) 10 MG tablet Take 10 mg by mouth daily as needed for allergies.    . Melatonin 3 MG CAPS Take 3 mg by mouth at bedtime as needed (for sleep).    . metoprolol succinate (TOPROL-XL) 25 MG 24 hr tablet Take 1 tablet (25 mg total) by mouth daily. 90 tablet 3  . nitroGLYCERIN (NITROSTAT) 0.4 MG SL tablet Place 1 tablet (0.4 mg total) under the tongue every 5 (five) minutes as needed for chest pain. 30 tablet 3  . rosuvastatin (CRESTOR) 10 MG tablet Take 1 tablet (10 mg total) by mouth daily. 90 tablet 3  . sertraline (ZOLOFT) 100 MG tablet Take 100 mg by mouth at bedtime.    .Marland Kitchentelmisartan (MICARDIS) 80 MG tablet Take 1 tablet (80  mg total) by mouth daily. 90 tablet 1  . traZODone (DESYREL) 100 MG tablet Take 100 mg by mouth at bedtime.     No current facility-administered medications on file prior to visit.    Cardiovascular studies:  EKG 10/08/2019: Sinus rhythm 69 bpm. Old anteroseptal infarct.   Renal [place]05/14/2020: No evidence of hydronephrosis on the left or the right. No significant echogenicity of the left or right kidney. Asymmetric size of the kidneys, with the left measuring 7.7 cm compared to the right, 10.0 cm Directed duplex on the right demonstrates no evidence of renal artery stenosis. Directed duplex on the left demonstrates borderline elevated velocity and ratio, however, there is parvus tardus waveform of the distal left renal arteries, a postobstructive waveform, suggesting higher degree of stenosis. Correlation with formal angiogram or as a second best test, CT angiogram, may be useful.  Echocardiogram 08/01/2017: Mild LVH. EF 55-60%. Grade II DD, Mild LA dilatation. Mild MR.  Nuclear stress test 08/02/2016: 4.6 METS. 95% MPHR, Frequent PVC. No ischemic changes.  Normal SPECT imaging. EF 55%. Low risk study.   Recent labs: 06/2019: Chol 125, TG 62, HDL 76, LDL 45  03/27/2019: Glucose 85, BUN/Cr 12/1.13. EGFR 48. Na/K 135/5.4.  08/14/2018: H/H 10.9/35.2. MCV 90.7. Platelets 289 TSH 0.7normal   12/16/2018: Glucose 68. BUN/Cr 26/1.16. eGFR 47. Na/K 134/5.0   06/2018: Chol 144,TG 88, HDL 70, LDL 56.  03/24/2018: Chol 125, TG 104, HDL 59, LDL 45    Review of Systems  Cardiovascular: Negative for chest pain, dyspnea on exertion, leg swelling, palpitations and syncope.        Vitals:   10/08/19 1104  BP: 100/66  Pulse: 78  Temp: (!) 97.3 F (36.3 C)  SpO2: 100%    Body mass index is 19.94 kg/m. Filed Weights   10/08/19 1104  Weight: 109 lb (49.4 kg)    Objective:   Physical Exam  Constitutional: She is oriented to person, place, and time. She  appears well-developed and well-nourished. No distress.  Neck: No JVD present.  Cardiovascular: Normal rate, regular rhythm, normal heart sounds and intact distal pulses.  No murmur heard. Pulmonary/Chest: Effort normal and breath sounds normal. She has no wheezes. She has no rales.  Musculoskeletal:        General: No edema.  Neurological: She is alert and oriented to person, place, and time.  Psychiatric: She has a normal mood and affect.  Nursing note and vitals reviewed.      Assessment & Recommendations:   74 y.o. Caucasian female with hypertension, anxiety, depression, degenerative disc disease, severe Vit D deficiency.  Hypertension: Well controlled. Even though there may be a possibility of renal artery stenosis, blood pressure is well controlled and does not need any further workup. With significant weight loss, she in fact may be able to reduce her antihypertensive therapy. I have recommended her to reduce telmisartan to 40 mg daily, in view of her CKD, and see if she is able to tolerate this.   Hyperlipidemia: Well controlled on rosuvastatin 10 mg.   I will see her on as needed basis.  Nigel Mormon, MD [facility] Pager: [phone] Office: [phone] If no answer Cell [phone]

## 2019-11-03 DIAGNOSIS — N183 Chronic kidney disease, stage 3 unspecified: Secondary | ICD-10-CM | POA: Diagnosis not present

## 2019-11-03 DIAGNOSIS — E875 Hyperkalemia: Secondary | ICD-10-CM | POA: Diagnosis not present

## 2019-11-03 DIAGNOSIS — N261 Atrophy of kidney (terminal): Secondary | ICD-10-CM | POA: Diagnosis not present

## 2019-11-03 DIAGNOSIS — I700 Atherosclerosis of aorta: Secondary | ICD-10-CM | POA: Diagnosis not present

## 2019-11-03 DIAGNOSIS — I129 Hypertensive chronic kidney disease with stage 1 through stage 4 chronic kidney disease, or unspecified chronic kidney disease: Secondary | ICD-10-CM | POA: Diagnosis not present

## 2019-11-05 DIAGNOSIS — I7300 Raynaud's syndrome without gangrene: Secondary | ICD-10-CM | POA: Diagnosis not present

## 2019-11-05 DIAGNOSIS — M1990 Unspecified osteoarthritis, unspecified site: Secondary | ICD-10-CM | POA: Diagnosis not present

## 2019-11-05 DIAGNOSIS — M549 Dorsalgia, unspecified: Secondary | ICD-10-CM | POA: Diagnosis not present

## 2019-11-05 DIAGNOSIS — M25512 Pain in left shoulder: Secondary | ICD-10-CM | POA: Diagnosis not present

## 2019-11-05 DIAGNOSIS — M064 Inflammatory polyarthropathy: Secondary | ICD-10-CM | POA: Diagnosis not present

## 2019-11-05 DIAGNOSIS — M7552 Bursitis of left shoulder: Secondary | ICD-10-CM | POA: Diagnosis not present

## 2019-11-05 DIAGNOSIS — M79643 Pain in unspecified hand: Secondary | ICD-10-CM | POA: Diagnosis not present

## 2019-11-09 DIAGNOSIS — M064 Inflammatory polyarthropathy: Secondary | ICD-10-CM | POA: Diagnosis not present

## 2019-11-09 DIAGNOSIS — L859 Epidermal thickening, unspecified: Secondary | ICD-10-CM | POA: Diagnosis not present

## 2019-11-09 DIAGNOSIS — M1990 Unspecified osteoarthritis, unspecified site: Secondary | ICD-10-CM | POA: Diagnosis not present

## 2019-11-09 DIAGNOSIS — S60851A Superficial foreign body of right wrist, initial encounter: Secondary | ICD-10-CM | POA: Diagnosis not present

## 2019-11-09 DIAGNOSIS — M549 Dorsalgia, unspecified: Secondary | ICD-10-CM | POA: Diagnosis not present

## 2019-11-09 DIAGNOSIS — N189 Chronic kidney disease, unspecified: Secondary | ICD-10-CM | POA: Diagnosis not present

## 2019-11-09 DIAGNOSIS — E875 Hyperkalemia: Secondary | ICD-10-CM | POA: Diagnosis not present

## 2019-11-09 DIAGNOSIS — L989 Disorder of the skin and subcutaneous tissue, unspecified: Secondary | ICD-10-CM | POA: Diagnosis not present

## 2019-11-09 DIAGNOSIS — I7300 Raynaud's syndrome without gangrene: Secondary | ICD-10-CM | POA: Diagnosis not present

## 2019-11-09 DIAGNOSIS — M79643 Pain in unspecified hand: Secondary | ICD-10-CM | POA: Diagnosis not present

## 2019-11-18 DIAGNOSIS — F339 Major depressive disorder, recurrent, unspecified: Secondary | ICD-10-CM | POA: Diagnosis not present

## 2019-11-18 DIAGNOSIS — F5101 Primary insomnia: Secondary | ICD-10-CM | POA: Diagnosis not present

## 2020-02-08 DIAGNOSIS — M1990 Unspecified osteoarthritis, unspecified site: Secondary | ICD-10-CM | POA: Diagnosis not present

## 2020-02-08 DIAGNOSIS — M064 Inflammatory polyarthropathy: Secondary | ICD-10-CM | POA: Diagnosis not present

## 2020-02-08 DIAGNOSIS — M79643 Pain in unspecified hand: Secondary | ICD-10-CM | POA: Diagnosis not present

## 2020-02-08 DIAGNOSIS — I7300 Raynaud's syndrome without gangrene: Secondary | ICD-10-CM | POA: Diagnosis not present

## 2020-02-08 DIAGNOSIS — M549 Dorsalgia, unspecified: Secondary | ICD-10-CM | POA: Diagnosis not present

## 2020-03-03 DIAGNOSIS — Z20822 Contact with and (suspected) exposure to covid-19: Secondary | ICD-10-CM | POA: Diagnosis not present

## 2020-03-13 ENCOUNTER — Other Ambulatory Visit: Payer: Self-pay | Admitting: Cardiology

## 2020-03-13 DIAGNOSIS — I10 Essential (primary) hypertension: Secondary | ICD-10-CM

## 2020-03-19 ENCOUNTER — Other Ambulatory Visit: Payer: Self-pay | Admitting: Cardiology

## 2020-03-19 DIAGNOSIS — I10 Essential (primary) hypertension: Secondary | ICD-10-CM

## 2020-05-16 ENCOUNTER — Other Ambulatory Visit: Payer: Self-pay | Admitting: Cardiology

## 2020-05-16 DIAGNOSIS — E782 Mixed hyperlipidemia: Secondary | ICD-10-CM

## 2020-06-08 DIAGNOSIS — Z23 Encounter for immunization: Secondary | ICD-10-CM | POA: Diagnosis not present

## 2020-07-13 DIAGNOSIS — M064 Inflammatory polyarthropathy: Secondary | ICD-10-CM | POA: Diagnosis not present

## 2020-07-13 DIAGNOSIS — M549 Dorsalgia, unspecified: Secondary | ICD-10-CM | POA: Diagnosis not present

## 2020-07-13 DIAGNOSIS — I7300 Raynaud's syndrome without gangrene: Secondary | ICD-10-CM | POA: Diagnosis not present

## 2020-07-13 DIAGNOSIS — M79643 Pain in unspecified hand: Secondary | ICD-10-CM | POA: Diagnosis not present

## 2020-07-13 DIAGNOSIS — M1990 Unspecified osteoarthritis, unspecified site: Secondary | ICD-10-CM | POA: Diagnosis not present

## 2020-07-19 DIAGNOSIS — I10 Essential (primary) hypertension: Secondary | ICD-10-CM | POA: Diagnosis not present

## 2020-07-19 DIAGNOSIS — R5383 Other fatigue: Secondary | ICD-10-CM | POA: Diagnosis not present

## 2020-07-19 DIAGNOSIS — E7800 Pure hypercholesterolemia, unspecified: Secondary | ICD-10-CM | POA: Diagnosis not present

## 2020-07-19 DIAGNOSIS — N189 Chronic kidney disease, unspecified: Secondary | ICD-10-CM | POA: Diagnosis not present

## 2020-07-19 DIAGNOSIS — D508 Other iron deficiency anemias: Secondary | ICD-10-CM | POA: Diagnosis not present

## 2020-07-20 DIAGNOSIS — R5383 Other fatigue: Secondary | ICD-10-CM | POA: Diagnosis not present

## 2020-07-20 DIAGNOSIS — D508 Other iron deficiency anemias: Secondary | ICD-10-CM | POA: Diagnosis not present

## 2020-07-20 DIAGNOSIS — N189 Chronic kidney disease, unspecified: Secondary | ICD-10-CM | POA: Diagnosis not present

## 2020-07-20 DIAGNOSIS — I10 Essential (primary) hypertension: Secondary | ICD-10-CM | POA: Diagnosis not present

## 2020-07-20 DIAGNOSIS — Z79899 Other long term (current) drug therapy: Secondary | ICD-10-CM | POA: Diagnosis not present

## 2020-07-20 DIAGNOSIS — E7800 Pure hypercholesterolemia, unspecified: Secondary | ICD-10-CM | POA: Diagnosis not present

## 2020-07-25 DIAGNOSIS — I10 Essential (primary) hypertension: Secondary | ICD-10-CM | POA: Diagnosis not present

## 2020-07-25 DIAGNOSIS — F339 Major depressive disorder, recurrent, unspecified: Secondary | ICD-10-CM | POA: Diagnosis not present

## 2020-07-25 DIAGNOSIS — N1831 Chronic kidney disease, stage 3a: Secondary | ICD-10-CM | POA: Diagnosis not present

## 2020-07-25 DIAGNOSIS — I119 Hypertensive heart disease without heart failure: Secondary | ICD-10-CM | POA: Diagnosis not present

## 2020-07-25 DIAGNOSIS — F418 Other specified anxiety disorders: Secondary | ICD-10-CM | POA: Diagnosis not present

## 2020-07-25 DIAGNOSIS — K52839 Microscopic colitis, unspecified: Secondary | ICD-10-CM | POA: Diagnosis not present

## 2020-07-25 DIAGNOSIS — M064 Inflammatory polyarthropathy: Secondary | ICD-10-CM | POA: Diagnosis not present

## 2020-07-25 DIAGNOSIS — F5101 Primary insomnia: Secondary | ICD-10-CM | POA: Diagnosis not present

## 2020-07-25 DIAGNOSIS — Z0001 Encounter for general adult medical examination with abnormal findings: Secondary | ICD-10-CM | POA: Diagnosis not present

## 2020-07-25 DIAGNOSIS — E7800 Pure hypercholesterolemia, unspecified: Secondary | ICD-10-CM | POA: Diagnosis not present

## 2020-07-25 DIAGNOSIS — D509 Iron deficiency anemia, unspecified: Secondary | ICD-10-CM | POA: Diagnosis not present

## 2020-08-02 ENCOUNTER — Telehealth: Payer: Self-pay | Admitting: Hematology and Oncology

## 2020-08-02 ENCOUNTER — Other Ambulatory Visit: Payer: Self-pay | Admitting: Hematology and Oncology

## 2020-08-02 DIAGNOSIS — D500 Iron deficiency anemia secondary to blood loss (chronic): Secondary | ICD-10-CM

## 2020-08-02 DIAGNOSIS — Z1212 Encounter for screening for malignant neoplasm of rectum: Secondary | ICD-10-CM | POA: Diagnosis not present

## 2020-08-02 DIAGNOSIS — K52839 Microscopic colitis, unspecified: Secondary | ICD-10-CM

## 2020-08-02 NOTE — Telephone Encounter (Signed)
Scheduled infusion appointment per 1/4 schedule message. Patient is aware.

## 2020-08-13 ENCOUNTER — Encounter: Payer: Self-pay | Admitting: Hematology and Oncology

## 2020-08-15 ENCOUNTER — Other Ambulatory Visit: Payer: Self-pay | Admitting: Hematology and Oncology

## 2020-08-15 ENCOUNTER — Inpatient Hospital Stay: Payer: Medicare Other | Attending: Hematology and Oncology

## 2020-08-15 ENCOUNTER — Inpatient Hospital Stay (HOSPITAL_BASED_OUTPATIENT_CLINIC_OR_DEPARTMENT_OTHER): Payer: Medicare Other | Admitting: Hematology and Oncology

## 2020-08-15 ENCOUNTER — Other Ambulatory Visit: Payer: Self-pay

## 2020-08-15 ENCOUNTER — Encounter: Payer: Self-pay | Admitting: Hematology and Oncology

## 2020-08-15 ENCOUNTER — Telehealth: Payer: Self-pay

## 2020-08-15 ENCOUNTER — Inpatient Hospital Stay: Payer: Medicare Other

## 2020-08-15 VITALS — BP 117/71 | HR 58 | Temp 98.3°F | Resp 16

## 2020-08-15 DIAGNOSIS — N1831 Chronic kidney disease, stage 3a: Secondary | ICD-10-CM | POA: Diagnosis not present

## 2020-08-15 DIAGNOSIS — G8929 Other chronic pain: Secondary | ICD-10-CM | POA: Insufficient documentation

## 2020-08-15 DIAGNOSIS — D500 Iron deficiency anemia secondary to blood loss (chronic): Secondary | ICD-10-CM

## 2020-08-15 DIAGNOSIS — K52839 Microscopic colitis, unspecified: Secondary | ICD-10-CM

## 2020-08-15 DIAGNOSIS — M549 Dorsalgia, unspecified: Secondary | ICD-10-CM | POA: Insufficient documentation

## 2020-08-15 DIAGNOSIS — E538 Deficiency of other specified B group vitamins: Secondary | ICD-10-CM

## 2020-08-15 DIAGNOSIS — K529 Noninfective gastroenteritis and colitis, unspecified: Secondary | ICD-10-CM | POA: Insufficient documentation

## 2020-08-15 LAB — CBC WITH DIFFERENTIAL/PLATELET
Abs Immature Granulocytes: 0.03 10*3/uL (ref 0.00–0.07)
Basophils Absolute: 0.1 10*3/uL (ref 0.0–0.1)
Basophils Relative: 1 %
Eosinophils Absolute: 0.1 10*3/uL (ref 0.0–0.5)
Eosinophils Relative: 1 %
HCT: 34.3 % — ABNORMAL LOW (ref 36.0–46.0)
Hemoglobin: 10.7 g/dL — ABNORMAL LOW (ref 12.0–15.0)
Immature Granulocytes: 0 %
Lymphocytes Relative: 25 %
Lymphs Abs: 2.3 10*3/uL (ref 0.7–4.0)
MCH: 28.5 pg (ref 26.0–34.0)
MCHC: 31.2 g/dL (ref 30.0–36.0)
MCV: 91.5 fL (ref 80.0–100.0)
Monocytes Absolute: 0.5 10*3/uL (ref 0.1–1.0)
Monocytes Relative: 6 %
Neutro Abs: 6 10*3/uL (ref 1.7–7.7)
Neutrophils Relative %: 67 %
Platelets: 270 10*3/uL (ref 150–400)
RBC: 3.75 MIL/uL — ABNORMAL LOW (ref 3.87–5.11)
RDW: 13.2 % (ref 11.5–15.5)
WBC: 9 10*3/uL (ref 4.0–10.5)
nRBC: 0 % (ref 0.0–0.2)

## 2020-08-15 LAB — IRON AND TIBC
Iron: 36 ug/dL — ABNORMAL LOW (ref 41–142)
Saturation Ratios: 14 % — ABNORMAL LOW (ref 21–57)
TIBC: 261 ug/dL (ref 236–444)
UIBC: 225 ug/dL (ref 120–384)

## 2020-08-15 LAB — FERRITIN: Ferritin: 78 ng/mL (ref 11–307)

## 2020-08-15 LAB — VITAMIN B12: Vitamin B-12: 823 pg/mL (ref 180–914)

## 2020-08-15 MED ORDER — SODIUM CHLORIDE 0.9 % IV SOLN
510.0000 mg | Freq: Once | INTRAVENOUS | Status: AC
  Administered 2020-08-15: 510 mg via INTRAVENOUS
  Filled 2020-08-15: qty 510

## 2020-08-15 MED ORDER — SODIUM CHLORIDE 0.9 % IV SOLN
Freq: Once | INTRAVENOUS | Status: AC
  Filled 2020-08-15: qty 250

## 2020-08-15 NOTE — Patient Instructions (Signed)
Ferumoxytol injection What is this medicine? FERUMOXYTOL is an iron complex. Iron is used to make healthy red blood cells, which carry oxygen and nutrients throughout the body. This medicine is used to treat iron deficiency anemia. This medicine may be used for other purposes; ask your health care provider or pharmacist if you have questions. COMMON BRAND NAME(S): Feraheme What should I tell my health care provider before I take this medicine? They need to know if you have any of these conditions:  anemia not caused by low iron levels  high levels of iron in the blood  magnetic resonance imaging (MRI) test scheduled  an unusual or allergic reaction to iron, other medicines, foods, dyes, or preservatives  pregnant or trying to get pregnant  breast-feeding How should I use this medicine? This medicine is for injection into a vein. It is given by a health care professional in a hospital or clinic setting. Talk to your pediatrician regarding the use of this medicine in children. Special care may be needed. Overdosage: If you think you have taken too much of this medicine contact a poison control center or emergency room at once. NOTE: This medicine is only for you. Do not share this medicine with others. What if I miss a dose? It is important not to miss your dose. Call your doctor or health care professional if you are unable to keep an appointment. What may interact with this medicine? This medicine may interact with the following medications:  other iron products This list may not describe all possible interactions. Give your health care provider a list of all the medicines, herbs, non-prescription drugs, or dietary supplements you use. Also tell them if you smoke, drink alcohol, or use illegal drugs. Some items may interact with your medicine. What should I watch for while using this medicine? Visit your doctor or healthcare professional regularly. Tell your doctor or healthcare  professional if your symptoms do not start to get better or if they get worse. You may need blood work done while you are taking this medicine. You may need to follow a special diet. Talk to your doctor. Foods that contain iron include: whole grains/cereals, dried fruits, beans, or peas, leafy green vegetables, and organ meats (liver, kidney). What side effects may I notice from receiving this medicine? Side effects that you should report to your doctor or health care professional as soon as possible:  allergic reactions like skin rash, itching or hives, swelling of the face, lips, or tongue  breathing problems  changes in blood pressure  feeling faint or lightheaded, falls  fever or chills  flushing, sweating, or hot feelings  swelling of the ankles or feet Side effects that usually do not require medical attention (report to your doctor or health care professional if they continue or are bothersome):  diarrhea  headache  nausea, vomiting  stomach pain This list may not describe all possible side effects. Call your doctor for medical advice about side effects. You may report side effects to FDA at 1-800-FDA-1088. Where should I keep my medicine? This drug is given in a hospital or clinic and will not be stored at home. NOTE: This sheet is a summary. It may not cover all possible information. If you have questions about this medicine, talk to your doctor, pharmacist, or health care provider.  2021 Elsevier/Gold Standard (2016-09-03 20:21:10)  

## 2020-08-15 NOTE — Telephone Encounter (Signed)
Called and given below message. She verbalized understanding. 

## 2020-08-15 NOTE — Progress Notes (Signed)
[facility] OFFICE PROGRESS NOTE  [name], MD  ASSESSMENT & PLAN:  Iron deficiency anemia due to chronic blood loss She is profoundly symptomatic from iron deficiency She has pica, excessive fatigue and persistent frequent colitis She could not tolerate oral iron supplement because it made her sick The most likely cause of her anemia is due to chronic blood loss/malabsorption syndrome. We discussed some of the risks, benefits, and alternatives of intravenous iron infusions. The patient is symptomatic from anemia and the iron level is critically low. She tolerated oral iron supplement poorly and desires to achieved higher levels of iron faster for adequate hematopoesis. Some of the side-effects to be expected including risks of infusion reactions, phlebitis, headaches, nausea and fatigue.  The patient is willing to proceed. Patient education material was dispensed.  Goal is to keep ferritin level greater than 100 and resolution of anemia  We will proceed with IV iron today and second dose next week I plan to see her middle of March for repeat blood work and further follow-up   Microscopic colitis, unspecified Oral iron supplement make her colitis worse We will proceed with intravenous iron because of this  Stage 3a chronic kidney disease She has stable chronic kidney disease This could also contribute to her anemia in the future   No orders of the defined types were placed in this encounter.   The total time spent in the appointment was 20 minutes encounter with patients including review of chart and various tests results, discussions about plan of care and coordination of care plan   All questions were answered. The patient knows to call the clinic with any problems, questions or concerns. No barriers to learning was detected.    Heath Lark, MD 1/17/20222:49 PM  INTERVAL HISTORY: Martha Higgins 75 y.o. female returns for further follow-up and  treatment Since last time I saw her, she has excessive fatigue She has ice craving She was placed on oral iron supplements which she tolerated poorly, exacerbating her frequent colitis She have average 2-3 bowel movement per day The patient denies any recent signs or symptoms of bleeding such as spontaneous epistaxis, hematuria or hematochezia.  SUMMARY OF HEMATOLOGIC HISTORY: Martha Higgins is seen here because of recent findings of iron deficiency anemia.  I have the opportunity to review outside records amounting to 28 pages and her internal electronic record.  Summary of her blood count monitoring is as follows:  1) June 19, 2016 hemoglobin 11.9 2) July 01, 2017 hemoglobin 12.5, creatinine 1.1 3) 01/07/2018, hemoglobin 11.3 4) July 09, 2018, hemoglobin 11.1, MCV 88.9, platelet count 343, white blood cell count 7.4, creatinine 1.3 and iron saturation 15%  She was found to have abnormal CBC from routine blood count monitoring She denies recent chest pain on exertion, shortness of breath on minimal exertion, pre-syncopal episodes, or palpitations. She had not noticed any recent bleeding such as epistaxis, hematuria or hematochezia The patient denies over the counter NSAID ingestion. She is not on antiplatelets agents. Her last colonoscopy was several years ago and showed microscopic colitis She had no prior history or diagnosis of cancer. Her age appropriate screening programs are up-to-date. She denies any pica and eats a variety of diet. She donated blood more than 5 years ago but never received blood transfusion  She had history of orthopedic surgery for chronic back pain secondary to injuries in the past She has chronic intermittent diarrhea due to microscopic colitis and is currently being referred to  see GI specialist at [facility]  On August 14, 2018, she underwent extensive evaluation which concluded borderline iron deficiency, chronic renal failure and  excluded multiple myeloma and other causes of anemia. Repeat blood work done by her primary care doctor last month showed persistent iron deficiency  I have reviewed the past medical history, past surgical history, social history and family history with the patient and they are unchanged from previous note.  ALLERGIES:  has No Known Allergies.  MEDICATIONS:  Current Outpatient Medications  Medication Sig Dispense Refill  . ALPRAZolam (XANAX) 1 MG tablet Take 1 mg by mouth 3 (three) times daily as needed for anxiety. Patient will sometimes take 0.43m-1mg    . buPROPion (WELLBUTRIN XL) 150 MG 24 hr tablet Take 300 mg by mouth daily.    .Marland KitchenCALCIUM PO Take 1 tablet by mouth daily.    . Cholecalciferol (VITAMIN D3) 1000 units CAPS Take 3,000 Units by mouth daily.    . Cyanocobalamin (VITAMIN B-12 PO) Take 1 tablet by mouth daily.    . cyclobenzaprine (FLEXERIL) 10 MG tablet Take 10 mg by mouth at bedtime.    . gabapentin (NEURONTIN) 300 MG capsule Take 300 mg by mouth at bedtime.    .Marland Kitchenloperamide (IMODIUM A-D) 2 MG tablet Take 2-4 mg by mouth 4 (four) times daily as needed for diarrhea or loose stools.    .Marland Kitchenloratadine (CLARITIN) 10 MG tablet Take 10 mg by mouth daily as needed for allergies.    . Melatonin 3 MG CAPS Take 3 mg by mouth at bedtime as needed (for sleep).    . metoprolol succinate (TOPROL-XL) 25 MG 24 hr tablet TAKE 1 TABLET(25 MG) BY MOUTH DAILY 90 tablet 0  . nitroGLYCERIN (NITROSTAT) 0.4 MG SL tablet Place 1 tablet (0.4 mg total) under the tongue every 5 (five) minutes as needed for chest pain. 30 tablet 3  . rosuvastatin (CRESTOR) 10 MG tablet TAKE 1 TABLET(10 MG) BY MOUTH DAILY 90 tablet 3  . sertraline (ZOLOFT) 100 MG tablet Take 100 mg by mouth at bedtime.    .Marland Kitchentelmisartan (MICARDIS) 80 MG tablet Take 1 tablet (80 mg total) by mouth daily. 90 tablet 1  . traZODone (DESYREL) 100 MG tablet Take 100 mg by mouth at bedtime.     No current facility-administered medications for this  visit.     REVIEW OF SYSTEMS:   Constitutional: Denies fevers, chills or night sweats Eyes: Denies blurriness of vision Ears, nose, mouth, throat, and face: Denies mucositis or sore throat Respiratory: Denies cough, dyspnea or wheezes Cardiovascular: Denies palpitation, chest discomfort or lower extremity swelling Gastrointestinal:  Denies nausea, heartburn or change in bowel habits Skin: Denies abnormal skin rashes Lymphatics: Denies new lymphadenopathy or easy bruising Neurological:Denies numbness, tingling or new weaknesses Behavioral/Psych: Mood is stable, no new changes  All other systems were reviewed with the patient and are negative.  PHYSICAL EXAMINATION: ECOG PERFORMANCE STATUS: 1 - Symptomatic but completely ambulatory  Vitals:   08/15/20 1214  BP: 140/78  Pulse: 73  Resp: 18  Temp: (!) 97.2 F (36.2 C)   Filed Weights   08/15/20 1214  Weight: 104 lb 4.8 oz (47.3 kg)    GENERAL:alert, no distress and comfortable NEURO: alert & oriented x 3 with fluent speech, no focal motor/sensory deficits  LABORATORY DATA:  I have reviewed the data as listed     Component Value Date/Time   NA 135 03/27/2019 1108   K 5.4 (H) 03/27/2019 1108  CL 98 03/27/2019 1108   CO2 25 03/27/2019 1108   GLUCOSE 85 03/27/2019 1108   GLUCOSE 94 08/14/2018 1059   BUN 12 03/27/2019 1108   CREATININE 1.13 (H) 03/27/2019 1108   CALCIUM 9.9 03/27/2019 1108   PROT 6.9 08/14/2018 1059   ALBUMIN 4.0 08/14/2018 1059   AST 17 08/14/2018 1059   ALT 11 08/14/2018 1059   ALKPHOS 70 08/14/2018 1059   BILITOT 0.3 08/14/2018 1059   GFRNONAA 48 (L) 03/27/2019 1108   GFRAA 56 (L) 03/27/2019 1108    No results found for: SPEP, UPEP  Lab Results  Component Value Date   WBC 9.0 08/15/2020   NEUTROABS 6.0 08/15/2020   HGB 10.7 (L) 08/15/2020   HCT 34.3 (L) 08/15/2020   MCV 91.5 08/15/2020   PLT 270 08/15/2020      Chemistry      Component Value Date/Time   NA 135 03/27/2019 1108   K  5.4 (H) 03/27/2019 1108   CL 98 03/27/2019 1108   CO2 25 03/27/2019 1108   BUN 12 03/27/2019 1108   CREATININE 1.13 (H) 03/27/2019 1108      Component Value Date/Time   CALCIUM 9.9 03/27/2019 1108   ALKPHOS 70 08/14/2018 1059   AST 17 08/14/2018 1059   ALT 11 08/14/2018 1059   BILITOT 0.3 08/14/2018 1059

## 2020-08-15 NOTE — Assessment & Plan Note (Signed)
She has stable chronic kidney disease This could also contribute to her anemia in the future

## 2020-08-15 NOTE — Assessment & Plan Note (Signed)
She is profoundly symptomatic from iron deficiency She has pica, excessive fatigue and persistent frequent colitis She could not tolerate oral iron supplement because it made her sick The most likely cause of her anemia is due to chronic blood loss/malabsorption syndrome. We discussed some of the risks, benefits, and alternatives of intravenous iron infusions. The patient is symptomatic from anemia and the iron level is critically low. She tolerated oral iron supplement poorly and desires to achieved higher levels of iron faster for adequate hematopoesis. Some of the side-effects to be expected including risks of infusion reactions, phlebitis, headaches, nausea and fatigue.  The patient is willing to proceed. Patient education material was dispensed.  Goal is to keep ferritin level greater than 100 and resolution of anemia  We will proceed with IV iron today and second dose next week I plan to see her middle of March for repeat blood work and further follow-up

## 2020-08-15 NOTE — Assessment & Plan Note (Addendum)
Oral iron supplement make her colitis worse We will proceed with intravenous iron because of this

## 2020-08-15 NOTE — Telephone Encounter (Signed)
-----   Message from Heath Lark, MD sent at 08/15/2020  2:08 PM EST ----- Regarding: pls let her know iron studies are low, similar to her PCP result

## 2020-08-19 ENCOUNTER — Telehealth: Payer: Self-pay

## 2020-08-19 NOTE — Telephone Encounter (Signed)
Called and reviewed upcoming appts. She verbalized understanding. 

## 2020-08-25 ENCOUNTER — Inpatient Hospital Stay: Payer: Medicare Other

## 2020-08-25 ENCOUNTER — Other Ambulatory Visit: Payer: Self-pay

## 2020-08-25 VITALS — BP 112/60 | HR 59 | Temp 98.2°F | Resp 16

## 2020-08-25 DIAGNOSIS — D500 Iron deficiency anemia secondary to blood loss (chronic): Secondary | ICD-10-CM

## 2020-08-25 DIAGNOSIS — K529 Noninfective gastroenteritis and colitis, unspecified: Secondary | ICD-10-CM | POA: Diagnosis not present

## 2020-08-25 DIAGNOSIS — M549 Dorsalgia, unspecified: Secondary | ICD-10-CM | POA: Diagnosis not present

## 2020-08-25 DIAGNOSIS — G8929 Other chronic pain: Secondary | ICD-10-CM | POA: Diagnosis not present

## 2020-08-25 DIAGNOSIS — K52839 Microscopic colitis, unspecified: Secondary | ICD-10-CM

## 2020-08-25 DIAGNOSIS — N1831 Chronic kidney disease, stage 3a: Secondary | ICD-10-CM | POA: Diagnosis not present

## 2020-08-25 MED ORDER — FERUMOXYTOL INJECTION 510 MG/17 ML
510.0000 mg | Freq: Once | INTRAVENOUS | Status: AC
  Administered 2020-08-25: 510 mg via INTRAVENOUS
  Filled 2020-08-25: qty 510

## 2020-08-25 MED ORDER — SODIUM CHLORIDE 0.9 % IV SOLN
Freq: Once | INTRAVENOUS | Status: AC
  Filled 2020-08-25: qty 250

## 2020-08-25 NOTE — Patient Instructions (Signed)
Ferumoxytol injection What is this medicine? FERUMOXYTOL is an iron complex. Iron is used to make healthy red blood cells, which carry oxygen and nutrients throughout the body. This medicine is used to treat iron deficiency anemia. This medicine may be used for other purposes; ask your health care provider or pharmacist if you have questions. COMMON BRAND NAME(S): Feraheme What should I tell my health care provider before I take this medicine? They need to know if you have any of these conditions:  anemia not caused by low iron levels  high levels of iron in the blood  magnetic resonance imaging (MRI) test scheduled  an unusual or allergic reaction to iron, other medicines, foods, dyes, or preservatives  pregnant or trying to get pregnant  breast-feeding How should I use this medicine? This medicine is for injection into a vein. It is given by a health care professional in a hospital or clinic setting. Talk to your pediatrician regarding the use of this medicine in children. Special care may be needed. Overdosage: If you think you have taken too much of this medicine contact a poison control center or emergency room at once. NOTE: This medicine is only for you. Do not share this medicine with others. What if I miss a dose? It is important not to miss your dose. Call your doctor or health care professional if you are unable to keep an appointment. What may interact with this medicine? This medicine may interact with the following medications:  other iron products This list may not describe all possible interactions. Give your health care provider a list of all the medicines, herbs, non-prescription drugs, or dietary supplements you use. Also tell them if you smoke, drink alcohol, or use illegal drugs. Some items may interact with your medicine. What should I watch for while using this medicine? Visit your doctor or healthcare professional regularly. Tell your doctor or healthcare  professional if your symptoms do not start to get better or if they get worse. You may need blood work done while you are taking this medicine. You may need to follow a special diet. Talk to your doctor. Foods that contain iron include: whole grains/cereals, dried fruits, beans, or peas, leafy green vegetables, and organ meats (liver, kidney). What side effects may I notice from receiving this medicine? Side effects that you should report to your doctor or health care professional as soon as possible:  allergic reactions like skin rash, itching or hives, swelling of the face, lips, or tongue  breathing problems  changes in blood pressure  feeling faint or lightheaded, falls  fever or chills  flushing, sweating, or hot feelings  swelling of the ankles or feet Side effects that usually do not require medical attention (report to your doctor or health care professional if they continue or are bothersome):  diarrhea  headache  nausea, vomiting  stomach pain This list may not describe all possible side effects. Call your doctor for medical advice about side effects. You may report side effects to FDA at 1-800-FDA-1088. Where should I keep my medicine? This drug is given in a hospital or clinic and will not be stored at home. NOTE: This sheet is a summary. It may not cover all possible information. If you have questions about this medicine, talk to your doctor, pharmacist, or health care provider.  2021 Elsevier/Gold Standard (2016-09-03 20:21:10)  

## 2020-09-08 ENCOUNTER — Telehealth: Payer: Self-pay

## 2020-09-08 NOTE — Telephone Encounter (Signed)
She called and ask for advice on what to do if she has abdominal cramps. She had a episode yesterday that eventually resolved.  Called back and per Dr. Alvy Bimler, If she has another attack, I suggest only clear liquids and avoid food for 24 to 48 hours. Once she is better, she can restart BRAT diet.  If not resolves by 48 hours or bleeding, she needs to go to ER. She verbalized understanding.

## 2020-09-13 DIAGNOSIS — G8929 Other chronic pain: Secondary | ICD-10-CM | POA: Diagnosis not present

## 2020-09-13 DIAGNOSIS — M546 Pain in thoracic spine: Secondary | ICD-10-CM | POA: Diagnosis not present

## 2020-09-28 DIAGNOSIS — M4804 Spinal stenosis, thoracic region: Secondary | ICD-10-CM | POA: Diagnosis not present

## 2020-09-28 DIAGNOSIS — M5134 Other intervertebral disc degeneration, thoracic region: Secondary | ICD-10-CM | POA: Diagnosis not present

## 2020-09-28 DIAGNOSIS — M546 Pain in thoracic spine: Secondary | ICD-10-CM | POA: Diagnosis not present

## 2020-10-04 DIAGNOSIS — M5136 Other intervertebral disc degeneration, lumbar region: Secondary | ICD-10-CM | POA: Diagnosis not present

## 2020-10-04 DIAGNOSIS — M4316 Spondylolisthesis, lumbar region: Secondary | ICD-10-CM | POA: Diagnosis not present

## 2020-10-06 ENCOUNTER — Other Ambulatory Visit: Payer: Self-pay | Admitting: Hematology and Oncology

## 2020-10-06 DIAGNOSIS — D500 Iron deficiency anemia secondary to blood loss (chronic): Secondary | ICD-10-CM

## 2020-10-07 ENCOUNTER — Inpatient Hospital Stay: Payer: Medicare Other | Attending: Hematology and Oncology | Admitting: Hematology and Oncology

## 2020-10-07 ENCOUNTER — Inpatient Hospital Stay: Payer: Medicare Other

## 2020-10-07 ENCOUNTER — Other Ambulatory Visit: Payer: Self-pay

## 2020-10-07 ENCOUNTER — Encounter: Payer: Self-pay | Admitting: Hematology and Oncology

## 2020-10-07 DIAGNOSIS — K529 Noninfective gastroenteritis and colitis, unspecified: Secondary | ICD-10-CM | POA: Insufficient documentation

## 2020-10-07 DIAGNOSIS — D500 Iron deficiency anemia secondary to blood loss (chronic): Secondary | ICD-10-CM | POA: Diagnosis not present

## 2020-10-07 DIAGNOSIS — N1831 Chronic kidney disease, stage 3a: Secondary | ICD-10-CM | POA: Diagnosis not present

## 2020-10-07 DIAGNOSIS — E538 Deficiency of other specified B group vitamins: Secondary | ICD-10-CM

## 2020-10-07 DIAGNOSIS — Z79899 Other long term (current) drug therapy: Secondary | ICD-10-CM | POA: Diagnosis not present

## 2020-10-07 DIAGNOSIS — K52839 Microscopic colitis, unspecified: Secondary | ICD-10-CM

## 2020-10-07 LAB — CBC WITH DIFFERENTIAL/PLATELET
Abs Immature Granulocytes: 0.02 10*3/uL (ref 0.00–0.07)
Basophils Absolute: 0.1 10*3/uL (ref 0.0–0.1)
Basophils Relative: 1 %
Eosinophils Absolute: 0.2 10*3/uL (ref 0.0–0.5)
Eosinophils Relative: 2 %
HCT: 35.3 % — ABNORMAL LOW (ref 36.0–46.0)
Hemoglobin: 11.4 g/dL — ABNORMAL LOW (ref 12.0–15.0)
Immature Granulocytes: 0 %
Lymphocytes Relative: 21 %
Lymphs Abs: 2 10*3/uL (ref 0.7–4.0)
MCH: 29.4 pg (ref 26.0–34.0)
MCHC: 32.3 g/dL (ref 30.0–36.0)
MCV: 91 fL (ref 80.0–100.0)
Monocytes Absolute: 0.6 10*3/uL (ref 0.1–1.0)
Monocytes Relative: 6 %
Neutro Abs: 6.5 10*3/uL (ref 1.7–7.7)
Neutrophils Relative %: 70 %
Platelets: 253 10*3/uL (ref 150–400)
RBC: 3.88 MIL/uL (ref 3.87–5.11)
RDW: 13.5 % (ref 11.5–15.5)
WBC: 9.3 10*3/uL (ref 4.0–10.5)
nRBC: 0 % (ref 0.0–0.2)

## 2020-10-07 LAB — IRON AND TIBC
Iron: 81 ug/dL (ref 41–142)
Saturation Ratios: 37 % (ref 21–57)
TIBC: 220 ug/dL — ABNORMAL LOW (ref 236–444)
UIBC: 139 ug/dL (ref 120–384)

## 2020-10-07 LAB — FERRITIN: Ferritin: 386 ng/mL — ABNORMAL HIGH (ref 11–307)

## 2020-10-07 NOTE — Assessment & Plan Note (Signed)
Her colitis is well controlled I would defer to her GI doctor for management

## 2020-10-07 NOTE — Assessment & Plan Note (Signed)
She has stable chronic kidney disease stage III I suspect this is the cause of her persistent anemia With adequate iron replacement, her hemoglobin is well above 11 There is no role for ESA at this point

## 2020-10-07 NOTE — Assessment & Plan Note (Signed)
She is still mildly anemic but her iron studies show adequate replacement She does not need further IV iron infusion She has no recent pica  We discussed follow-up She told me she has regular appointment with her primary care doctor and is comfortable to get her iron studies check with her primary care doctor If she has recurrent pica or signs of recurrent iron deficiency anemia, she will let me know and I will schedule her to return for IV iron

## 2020-10-07 NOTE — Progress Notes (Signed)
HEMATOLOGY-ONCOLOGY ELECTRONIC VISIT PROGRESS NOTE  Patient Care Team: [name], MD as PCP - General (Internal Medicine)  I connected with by telephone  ASSESSMENT & PLAN:  Iron deficiency anemia due to chronic blood loss She is still mildly anemic but her iron studies show adequate replacement She does not need further IV iron infusion She has no recent pica  We discussed follow-up She told me she has regular appointment with her primary care doctor and is comfortable to get her iron studies check with her primary care doctor If she has recurrent pica or signs of recurrent iron deficiency anemia, she will let me know and I will schedule her to return for IV iron  Microscopic colitis, unspecified Her colitis is well controlled I would defer to her GI doctor for management  Stage 3a chronic kidney disease She has stable chronic kidney disease stage III I suspect this is the cause of her persistent anemia With adequate iron replacement, her hemoglobin is well above 11 There is no role for ESA at this point   No orders of the defined types were placed in this encounter.   INTERVAL HISTORY: Please see below for problem oriented charting. The purpose of today's visit is to review recent test results She complained of persistent fatigue but overall improved She has no recent pica The patient denies any recent signs or symptoms of bleeding such as spontaneous epistaxis, hematuria or hematochezia. Her colitis is stable  SUMMARY OF HEMATOLOGIC HISTORY: Martha Higgins is seen here because of recent findings of iron deficiency anemia.  I have the opportunity to review outside records amounting to 28 pages and her internal electronic record.  Summary of her blood count monitoring is as follows:  1) June 19, 2016 hemoglobin 11.9 2) July 01, 2017 hemoglobin 12.5, creatinine 1.1 3) 01/07/2018, hemoglobin 11.3 4) July 09, 2018, hemoglobin 11.1, MCV 88.9, platelet  count 343, white blood cell count 7.4, creatinine 1.3 and iron saturation 15%  She was found to have abnormal CBC from routine blood count monitoring She denies recent chest pain on exertion, shortness of breath on minimal exertion, pre-syncopal episodes, or palpitations. She had not noticed any recent bleeding such as epistaxis, hematuria or hematochezia The patient denies over the counter NSAID ingestion. She is not on antiplatelets agents. Her last colonoscopy was several years ago and showed microscopic colitis She had no prior history or diagnosis of cancer. Her age appropriate screening programs are up-to-date. She denies any pica and eats a variety of diet. She donated blood more than 5 years ago but never received blood transfusion  She had history of orthopedic surgery for chronic back pain secondary to injuries in the past She has chronic intermittent diarrhea due to microscopic colitis and is currently being referred to see GI specialist at [facility]  On August 14, 2018, she underwent extensive evaluation which concluded borderline iron deficiency, chronic renal failure and excluded multiple myeloma and other causes of anemia. Repeat blood work done by her primary care doctor last month showed persistent iron deficiency She received 2 doses of IV iron Feraheme in January 2022  REVIEW OF SYSTEMS:   Constitutional: Denies fevers, chills or abnormal weight loss Eyes: Denies blurriness of vision Ears, nose, mouth, throat, and face: Denies mucositis or sore throat Respiratory: Denies cough, dyspnea or wheezes Cardiovascular: Denies palpitation, chest discomfort Gastrointestinal:  Denies nausea, heartburn or change in bowel habits Skin: Denies abnormal skin rashes Lymphatics: Denies new lymphadenopathy or easy bruising Neurological:Denies  numbness, tingling or new weaknesses Behavioral/Psych: Mood is stable, no new changes  Extremities: No lower extremity edema All other  systems were reviewed with the patient and are negative.  I have reviewed the past medical history, past surgical history, social history and family history with the patient and they are unchanged from previous note.  ALLERGIES:  has No Known Allergies.  MEDICATIONS:  Current Outpatient Medications  Medication Sig Dispense Refill  . ALPRAZolam (XANAX) 1 MG tablet Take 1 mg by mouth 3 (three) times daily as needed for anxiety. Patient will sometimes take 0.42m-1mg    . buPROPion (WELLBUTRIN XL) 150 MG 24 hr tablet Take 300 mg by mouth daily.    .Marland KitchenCALCIUM PO Take 1 tablet by mouth daily.    . Cholecalciferol (VITAMIN D3) 1000 units CAPS Take 3,000 Units by mouth daily.    . Cyanocobalamin (VITAMIN B-12 PO) Take 1 tablet by mouth daily.    . cyclobenzaprine (FLEXERIL) 10 MG tablet Take 10 mg by mouth at bedtime.    .Marland Kitchenloperamide (IMODIUM A-D) 2 MG tablet Take 2-4 mg by mouth 4 (four) times daily as needed for diarrhea or loose stools.    .Marland Kitchenloratadine (CLARITIN) 10 MG tablet Take 10 mg by mouth daily as needed for allergies.    . Melatonin 3 MG CAPS Take 3 mg by mouth at bedtime as needed (for sleep).    . metoprolol succinate (TOPROL-XL) 25 MG 24 hr tablet TAKE 1 TABLET(25 MG) BY MOUTH DAILY 90 tablet 0  . nitroGLYCERIN (NITROSTAT) 0.4 MG SL tablet Place 1 tablet (0.4 mg total) under the tongue every 5 (five) minutes as needed for chest pain. 30 tablet 3  . rosuvastatin (CRESTOR) 10 MG tablet TAKE 1 TABLET(10 MG) BY MOUTH DAILY 90 tablet 3  . telmisartan (MICARDIS) 80 MG tablet Take 1 tablet (80 mg total) by mouth daily. 90 tablet 1  . traZODone (DESYREL) 100 MG tablet Take 100 mg by mouth at bedtime.     No current facility-administered medications for this visit.    PHYSICAL EXAMINATION: ECOG PERFORMANCE STATUS: 1 - Symptomatic but completely ambulatory  LABORATORY DATA:  I have reviewed the data as listed CMP Latest Ref Rng & Units 03/27/2019 08/14/2018 01/07/2018  Glucose 65 - 99 mg/dL  85 94 101(H)  BUN 8 - 27 mg/dL 12 16 14   Creatinine 0.57 - 1.00 mg/dL 1.13(H) 1.21(H) 1.37(H)  Sodium 134 - 144 mmol/L 135 142 145  Potassium 3.5 - 5.2 mmol/L 5.4(H) 4.1 4.1  Chloride 96 - 106 mmol/L 98 108 110  CO2 20 - 29 mmol/L 25 27 28   Calcium 8.7 - 10.3 mg/dL 9.9 9.9 9.9  Total Protein 6.5 - 8.1 g/dL - 6.9 -  Total Bilirubin 0.3 - 1.2 mg/dL - 0.3 -  Alkaline Phos 38 - 126 U/L - 70 -  AST 15 - 41 U/L - 17 -  ALT 0 - 44 U/L - 11 -    Lab Results  Component Value Date   WBC 9.3 10/07/2020   HGB 11.4 (L) 10/07/2020   HCT 35.3 (L) 10/07/2020   MCV 91.0 10/07/2020   PLT 253 10/07/2020   NEUTROABS 6.5 10/07/2020    I discussed the assessment and treatment plan with the patient. The patient was provided an opportunity to ask questions and all were answered. The patient agreed with the plan and demonstrated an understanding of the instructions. The patient was advised to call back or seek an in-person evaluation if the  symptoms worsen or if the condition fails to improve as anticipated.    I spent 20 minutes for the appointment reviewing test results, discuss management and coordination of care.  Heath Lark, MD 10/07/2020 9:42 AM

## 2020-10-10 ENCOUNTER — Telehealth: Payer: Self-pay | Admitting: Hematology and Oncology

## 2020-10-10 NOTE — Telephone Encounter (Signed)
Checked out appointment. No LOS notes needing to be scheduled. No changes made. 

## 2020-11-08 DIAGNOSIS — M8589 Other specified disorders of bone density and structure, multiple sites: Secondary | ICD-10-CM | POA: Diagnosis not present

## 2020-11-08 DIAGNOSIS — N1831 Chronic kidney disease, stage 3a: Secondary | ICD-10-CM | POA: Diagnosis not present

## 2020-11-08 DIAGNOSIS — I10 Essential (primary) hypertension: Secondary | ICD-10-CM | POA: Diagnosis not present

## 2020-11-08 DIAGNOSIS — M810 Age-related osteoporosis without current pathological fracture: Secondary | ICD-10-CM | POA: Diagnosis not present

## 2020-11-09 DIAGNOSIS — E875 Hyperkalemia: Secondary | ICD-10-CM | POA: Diagnosis not present

## 2020-11-09 DIAGNOSIS — R5383 Other fatigue: Secondary | ICD-10-CM | POA: Diagnosis not present

## 2020-11-09 DIAGNOSIS — I129 Hypertensive chronic kidney disease with stage 1 through stage 4 chronic kidney disease, or unspecified chronic kidney disease: Secondary | ICD-10-CM | POA: Diagnosis not present

## 2020-11-09 DIAGNOSIS — N1832 Chronic kidney disease, stage 3b: Secondary | ICD-10-CM | POA: Diagnosis not present

## 2020-11-09 DIAGNOSIS — N261 Atrophy of kidney (terminal): Secondary | ICD-10-CM | POA: Diagnosis not present

## 2020-12-27 DIAGNOSIS — E7800 Pure hypercholesterolemia, unspecified: Secondary | ICD-10-CM | POA: Diagnosis not present

## 2020-12-27 DIAGNOSIS — I119 Hypertensive heart disease without heart failure: Secondary | ICD-10-CM | POA: Diagnosis not present

## 2020-12-27 DIAGNOSIS — M1990 Unspecified osteoarthritis, unspecified site: Secondary | ICD-10-CM | POA: Diagnosis not present

## 2020-12-27 DIAGNOSIS — N1831 Chronic kidney disease, stage 3a: Secondary | ICD-10-CM | POA: Diagnosis not present

## 2021-01-07 DIAGNOSIS — Z23 Encounter for immunization: Secondary | ICD-10-CM | POA: Diagnosis not present

## 2021-02-16 DIAGNOSIS — M79672 Pain in left foot: Secondary | ICD-10-CM | POA: Diagnosis not present

## 2021-02-16 DIAGNOSIS — M25572 Pain in left ankle and joints of left foot: Secondary | ICD-10-CM | POA: Diagnosis not present

## 2021-02-24 DIAGNOSIS — Z20822 Contact with and (suspected) exposure to covid-19: Secondary | ICD-10-CM | POA: Diagnosis not present

## 2021-02-26 DIAGNOSIS — E7800 Pure hypercholesterolemia, unspecified: Secondary | ICD-10-CM | POA: Diagnosis not present

## 2021-02-26 DIAGNOSIS — M1990 Unspecified osteoarthritis, unspecified site: Secondary | ICD-10-CM | POA: Diagnosis not present

## 2021-02-26 DIAGNOSIS — I119 Hypertensive heart disease without heart failure: Secondary | ICD-10-CM | POA: Diagnosis not present

## 2021-02-26 DIAGNOSIS — N1831 Chronic kidney disease, stage 3a: Secondary | ICD-10-CM | POA: Diagnosis not present

## 2021-03-29 DIAGNOSIS — M1990 Unspecified osteoarthritis, unspecified site: Secondary | ICD-10-CM | POA: Diagnosis not present

## 2021-03-29 DIAGNOSIS — N1831 Chronic kidney disease, stage 3a: Secondary | ICD-10-CM | POA: Diagnosis not present

## 2021-03-29 DIAGNOSIS — E7800 Pure hypercholesterolemia, unspecified: Secondary | ICD-10-CM | POA: Diagnosis not present

## 2021-03-29 DIAGNOSIS — I119 Hypertensive heart disease without heart failure: Secondary | ICD-10-CM | POA: Diagnosis not present

## 2021-05-24 DIAGNOSIS — T7849XA Other allergy, initial encounter: Secondary | ICD-10-CM | POA: Diagnosis not present

## 2021-05-24 DIAGNOSIS — S50812A Abrasion of left forearm, initial encounter: Secondary | ICD-10-CM | POA: Diagnosis not present

## 2021-05-29 DIAGNOSIS — M1990 Unspecified osteoarthritis, unspecified site: Secondary | ICD-10-CM | POA: Diagnosis not present

## 2021-05-29 DIAGNOSIS — Z20822 Contact with and (suspected) exposure to covid-19: Secondary | ICD-10-CM | POA: Diagnosis not present

## 2021-05-29 DIAGNOSIS — N1831 Chronic kidney disease, stage 3a: Secondary | ICD-10-CM | POA: Diagnosis not present

## 2021-05-29 DIAGNOSIS — I119 Hypertensive heart disease without heart failure: Secondary | ICD-10-CM | POA: Diagnosis not present

## 2021-05-29 DIAGNOSIS — E7800 Pure hypercholesterolemia, unspecified: Secondary | ICD-10-CM | POA: Diagnosis not present

## 2021-05-30 DIAGNOSIS — Z20822 Contact with and (suspected) exposure to covid-19: Secondary | ICD-10-CM | POA: Diagnosis not present

## 2021-06-05 DIAGNOSIS — M25572 Pain in left ankle and joints of left foot: Secondary | ICD-10-CM | POA: Diagnosis not present

## 2021-06-05 DIAGNOSIS — M792 Neuralgia and neuritis, unspecified: Secondary | ICD-10-CM | POA: Diagnosis not present

## 2021-07-28 DIAGNOSIS — R7301 Impaired fasting glucose: Secondary | ICD-10-CM | POA: Diagnosis not present

## 2021-07-28 DIAGNOSIS — M810 Age-related osteoporosis without current pathological fracture: Secondary | ICD-10-CM | POA: Diagnosis not present

## 2021-07-28 DIAGNOSIS — E7801 Familial hypercholesterolemia: Secondary | ICD-10-CM | POA: Diagnosis not present

## 2021-07-28 DIAGNOSIS — I10 Essential (primary) hypertension: Secondary | ICD-10-CM | POA: Diagnosis not present

## 2021-08-07 DIAGNOSIS — B351 Tinea unguium: Secondary | ICD-10-CM | POA: Diagnosis not present

## 2021-08-07 DIAGNOSIS — M25572 Pain in left ankle and joints of left foot: Secondary | ICD-10-CM | POA: Diagnosis not present

## 2021-08-07 DIAGNOSIS — M792 Neuralgia and neuritis, unspecified: Secondary | ICD-10-CM | POA: Diagnosis not present

## 2021-08-09 DIAGNOSIS — F339 Major depressive disorder, recurrent, unspecified: Secondary | ICD-10-CM | POA: Diagnosis not present

## 2021-08-09 DIAGNOSIS — Z8639 Personal history of other endocrine, nutritional and metabolic disease: Secondary | ICD-10-CM | POA: Diagnosis not present

## 2021-08-09 DIAGNOSIS — I10 Essential (primary) hypertension: Secondary | ICD-10-CM | POA: Diagnosis not present

## 2021-08-09 DIAGNOSIS — F419 Anxiety disorder, unspecified: Secondary | ICD-10-CM | POA: Diagnosis not present

## 2021-08-09 DIAGNOSIS — M810 Age-related osteoporosis without current pathological fracture: Secondary | ICD-10-CM | POA: Diagnosis not present

## 2021-08-09 DIAGNOSIS — E7800 Pure hypercholesterolemia, unspecified: Secondary | ICD-10-CM | POA: Diagnosis not present

## 2021-08-09 DIAGNOSIS — I340 Nonrheumatic mitral (valve) insufficiency: Secondary | ICD-10-CM | POA: Diagnosis not present

## 2021-08-09 DIAGNOSIS — Z0000 Encounter for general adult medical examination without abnormal findings: Secondary | ICD-10-CM | POA: Diagnosis not present

## 2021-08-09 DIAGNOSIS — Z23 Encounter for immunization: Secondary | ICD-10-CM | POA: Diagnosis not present

## 2021-08-09 DIAGNOSIS — D649 Anemia, unspecified: Secondary | ICD-10-CM | POA: Diagnosis not present

## 2021-08-17 DIAGNOSIS — I10 Essential (primary) hypertension: Secondary | ICD-10-CM | POA: Diagnosis not present

## 2021-08-17 DIAGNOSIS — M549 Dorsalgia, unspecified: Secondary | ICD-10-CM | POA: Diagnosis not present

## 2021-08-17 DIAGNOSIS — I7300 Raynaud's syndrome without gangrene: Secondary | ICD-10-CM | POA: Diagnosis not present

## 2021-08-17 DIAGNOSIS — N189 Chronic kidney disease, unspecified: Secondary | ICD-10-CM | POA: Diagnosis not present

## 2021-08-17 DIAGNOSIS — M79643 Pain in unspecified hand: Secondary | ICD-10-CM | POA: Diagnosis not present

## 2021-08-17 DIAGNOSIS — M1990 Unspecified osteoarthritis, unspecified site: Secondary | ICD-10-CM | POA: Diagnosis not present

## 2021-08-25 DIAGNOSIS — I10 Essential (primary) hypertension: Secondary | ICD-10-CM | POA: Diagnosis not present

## 2021-08-25 DIAGNOSIS — M549 Dorsalgia, unspecified: Secondary | ICD-10-CM | POA: Diagnosis not present

## 2021-08-25 DIAGNOSIS — M1990 Unspecified osteoarthritis, unspecified site: Secondary | ICD-10-CM | POA: Diagnosis not present

## 2021-08-25 DIAGNOSIS — I7300 Raynaud's syndrome without gangrene: Secondary | ICD-10-CM | POA: Diagnosis not present

## 2021-08-25 DIAGNOSIS — M25562 Pain in left knee: Secondary | ICD-10-CM | POA: Diagnosis not present

## 2021-08-25 DIAGNOSIS — N189 Chronic kidney disease, unspecified: Secondary | ICD-10-CM | POA: Diagnosis not present

## 2021-08-25 DIAGNOSIS — M25462 Effusion, left knee: Secondary | ICD-10-CM | POA: Diagnosis not present

## 2021-09-12 DIAGNOSIS — R0981 Nasal congestion: Secondary | ICD-10-CM | POA: Diagnosis not present

## 2021-09-12 DIAGNOSIS — J029 Acute pharyngitis, unspecified: Secondary | ICD-10-CM | POA: Diagnosis not present

## 2021-10-20 DIAGNOSIS — G4452 New daily persistent headache (NDPH): Secondary | ICD-10-CM | POA: Diagnosis not present

## 2021-10-20 DIAGNOSIS — Z9109 Other allergy status, other than to drugs and biological substances: Secondary | ICD-10-CM | POA: Diagnosis not present

## 2021-10-26 DIAGNOSIS — S96912A Strain of unspecified muscle and tendon at ankle and foot level, left foot, initial encounter: Secondary | ICD-10-CM | POA: Diagnosis not present

## 2021-11-06 DIAGNOSIS — M792 Neuralgia and neuritis, unspecified: Secondary | ICD-10-CM | POA: Diagnosis not present

## 2021-11-06 DIAGNOSIS — M25572 Pain in left ankle and joints of left foot: Secondary | ICD-10-CM | POA: Diagnosis not present

## 2021-11-06 DIAGNOSIS — S93492D Sprain of other ligament of left ankle, subsequent encounter: Secondary | ICD-10-CM | POA: Diagnosis not present

## 2021-11-06 DIAGNOSIS — B351 Tinea unguium: Secondary | ICD-10-CM | POA: Diagnosis not present

## 2021-11-13 DIAGNOSIS — N1832 Chronic kidney disease, stage 3b: Secondary | ICD-10-CM | POA: Diagnosis not present

## 2021-11-16 DIAGNOSIS — R5383 Other fatigue: Secondary | ICD-10-CM | POA: Diagnosis not present

## 2021-11-16 DIAGNOSIS — E871 Hypo-osmolality and hyponatremia: Secondary | ICD-10-CM | POA: Diagnosis not present

## 2021-11-16 DIAGNOSIS — N1831 Chronic kidney disease, stage 3a: Secondary | ICD-10-CM | POA: Diagnosis not present

## 2021-11-16 DIAGNOSIS — N261 Atrophy of kidney (terminal): Secondary | ICD-10-CM | POA: Diagnosis not present

## 2021-11-16 DIAGNOSIS — I129 Hypertensive chronic kidney disease with stage 1 through stage 4 chronic kidney disease, or unspecified chronic kidney disease: Secondary | ICD-10-CM | POA: Diagnosis not present

## 2021-11-16 DIAGNOSIS — E875 Hyperkalemia: Secondary | ICD-10-CM | POA: Diagnosis not present

## 2021-11-24 DIAGNOSIS — N1832 Chronic kidney disease, stage 3b: Secondary | ICD-10-CM | POA: Diagnosis not present

## 2021-12-22 DIAGNOSIS — N1832 Chronic kidney disease, stage 3b: Secondary | ICD-10-CM | POA: Diagnosis not present

## 2021-12-27 DIAGNOSIS — E875 Hyperkalemia: Secondary | ICD-10-CM | POA: Diagnosis not present

## 2021-12-27 DIAGNOSIS — N1831 Chronic kidney disease, stage 3a: Secondary | ICD-10-CM | POA: Diagnosis not present

## 2021-12-27 DIAGNOSIS — N261 Atrophy of kidney (terminal): Secondary | ICD-10-CM | POA: Diagnosis not present

## 2021-12-27 DIAGNOSIS — E871 Hypo-osmolality and hyponatremia: Secondary | ICD-10-CM | POA: Diagnosis not present

## 2021-12-27 DIAGNOSIS — I129 Hypertensive chronic kidney disease with stage 1 through stage 4 chronic kidney disease, or unspecified chronic kidney disease: Secondary | ICD-10-CM | POA: Diagnosis not present

## 2022-02-13 DIAGNOSIS — I119 Hypertensive heart disease without heart failure: Secondary | ICD-10-CM | POA: Diagnosis not present

## 2022-02-13 DIAGNOSIS — R5383 Other fatigue: Secondary | ICD-10-CM | POA: Diagnosis not present

## 2022-02-13 DIAGNOSIS — F5101 Primary insomnia: Secondary | ICD-10-CM | POA: Diagnosis not present

## 2022-02-13 DIAGNOSIS — F418 Other specified anxiety disorders: Secondary | ICD-10-CM | POA: Diagnosis not present

## 2022-02-15 ENCOUNTER — Other Ambulatory Visit: Payer: Self-pay | Admitting: Internal Medicine

## 2022-02-15 DIAGNOSIS — I119 Hypertensive heart disease without heart failure: Secondary | ICD-10-CM

## 2022-02-22 DIAGNOSIS — F5101 Primary insomnia: Secondary | ICD-10-CM | POA: Diagnosis not present

## 2022-02-22 DIAGNOSIS — I10 Essential (primary) hypertension: Secondary | ICD-10-CM | POA: Diagnosis not present

## 2022-02-22 DIAGNOSIS — E871 Hypo-osmolality and hyponatremia: Secondary | ICD-10-CM | POA: Diagnosis not present

## 2022-03-07 DIAGNOSIS — E213 Hyperparathyroidism, unspecified: Secondary | ICD-10-CM | POA: Diagnosis not present

## 2022-03-07 DIAGNOSIS — E871 Hypo-osmolality and hyponatremia: Secondary | ICD-10-CM | POA: Diagnosis not present

## 2022-03-07 DIAGNOSIS — F339 Major depressive disorder, recurrent, unspecified: Secondary | ICD-10-CM | POA: Diagnosis not present

## 2022-03-27 DIAGNOSIS — M8580 Other specified disorders of bone density and structure, unspecified site: Secondary | ICD-10-CM | POA: Diagnosis not present

## 2022-03-27 DIAGNOSIS — E210 Primary hyperparathyroidism: Secondary | ICD-10-CM | POA: Diagnosis not present

## 2022-03-27 DIAGNOSIS — I10 Essential (primary) hypertension: Secondary | ICD-10-CM | POA: Diagnosis not present

## 2022-03-28 DIAGNOSIS — N1832 Chronic kidney disease, stage 3b: Secondary | ICD-10-CM | POA: Diagnosis not present

## 2022-04-20 DIAGNOSIS — I10 Essential (primary) hypertension: Secondary | ICD-10-CM | POA: Diagnosis not present

## 2022-04-20 DIAGNOSIS — I119 Hypertensive heart disease without heart failure: Secondary | ICD-10-CM | POA: Diagnosis not present

## 2022-04-20 DIAGNOSIS — N1831 Chronic kidney disease, stage 3a: Secondary | ICD-10-CM | POA: Diagnosis not present

## 2022-04-23 ENCOUNTER — Other Ambulatory Visit: Payer: Self-pay | Admitting: Internal Medicine

## 2022-04-23 DIAGNOSIS — I10 Essential (primary) hypertension: Secondary | ICD-10-CM

## 2022-05-16 DIAGNOSIS — B351 Tinea unguium: Secondary | ICD-10-CM | POA: Diagnosis not present

## 2022-05-28 ENCOUNTER — Ambulatory Visit (INDEPENDENT_AMBULATORY_CARE_PROVIDER_SITE_OTHER): Payer: Medicare Other | Admitting: Family Medicine

## 2022-05-28 ENCOUNTER — Ambulatory Visit (INDEPENDENT_AMBULATORY_CARE_PROVIDER_SITE_OTHER): Payer: Medicare Other

## 2022-05-28 ENCOUNTER — Ambulatory Visit: Payer: Self-pay

## 2022-05-28 VITALS — BP 146/92 | HR 69 | Ht 62.0 in | Wt 115.0 lb

## 2022-05-28 DIAGNOSIS — M25562 Pain in left knee: Secondary | ICD-10-CM | POA: Diagnosis not present

## 2022-05-28 DIAGNOSIS — G8929 Other chronic pain: Secondary | ICD-10-CM | POA: Diagnosis not present

## 2022-05-28 NOTE — Progress Notes (Signed)
I, Peterson Lombard, LAT, ATC acting as a scribe for Lynne Leader, MD.  Subjective:    CC: L knee pain   HPI: Patient is a 76 year old female complaining of L knee pain ongoing for a few months. Pt has a prior dx of kidney disease. Pt watches her great grandchild on Thursday, who likes to stand up and bounce on her thighs. Patient locates pain to distal portion of the L thigh and throughout the L knee.   L knee swelling: no Mechanical symptoms: no Aggravates: walking, transitioning to stand Treatments tried: lidocaine ointment, Aspercream patch  Pertinent review of Systems: No fevers or chills  Relevant historical information: History of a subdural hematoma.  Stage III chronic kidney disease.   Objective:    Vitals:   05/28/22 0927  BP: (!) 146/92  Pulse: 69  SpO2: 98%   General: Well Developed, well nourished, and in no acute distress.   MSK: Left knee mild effusion normal AP tearing otherwise. Normal motion with crepitation. Tender palpation medial and lateral joint line. Stable ligamentous exam. Intact strength some pain with resisted knee extension is present. Negative McMurray's test.  Lab and Radiology Results  Procedure: Real-time Ultrasound Guided Injection of left knee Device: Philips Affiniti 50G Images permanently stored and available for review in PACS Ultrasound evaluation prior to injection reveals mild joint effusion.  Chondrocalcinosis is present both medial and lateral joint line but most dominant on the lateral joint line.  Mild degenerative changes present medial joint line. Verbal informed consent obtained.  Discussed risks and benefits of procedure. Warned about infection, bleeding, hyperglycemia damage to structures among others. Patient expresses understanding and agreement Time-out conducted.   Noted no overlying erythema, induration, or other signs of local infection.   Skin prepped in a sterile fashion.   Local anesthesia: Topical Ethyl  chloride.   With sterile technique and under real time ultrasound guidance: 40 mg of Kenalog and 2 mL of Marcaine injected into knee joint. Fluid seen entering the joint capsule.   Completed without difficulty   Pain immediately resolved suggesting accurate placement of the medication.   Advised to call if fevers/chills, erythema, induration, drainage, or persistent bleeding.   Images permanently stored and available for review in the ultrasound unit.  Impression: Technically successful ultrasound guided injection.    X-ray images left knee obtained today personally and independently interpreted Mild medial and patellofemoral DJD.  Trace lateral compartment chondromalacia on x-ray Await formal radiology review   Impression and Recommendations:    Assessment and Plan: 76 y.o. female with left knee pain thought to be due to an exacerbation of DJD.  Patient does have chondrocalcinosis as well which could indicate pseudogout is a possibility.  Plan for steroid injection and Voltaren gel as well as oral Tylenol.  Check back as needed especially if not improving.Marland Kitchen  PDMP not reviewed this encounter. Orders Placed This Encounter  Procedures   DG Knee AP/LAT W/Sunrise Left    Standing Status:   Future    Standing Expiration Date:   06/28/2022    Order Specific Question:   Reason for Exam (SYMPTOM  OR DIAGNOSIS REQUIRED)    Answer:   left knee pai    Order Specific Question:   Preferred imaging location?    Answer:   [facility]   [place] LIMITED JOINT SPACE STRUCTURES LOW LEFT(NO LINKED CHARGES)    Order Specific Question:   Reason for Exam (SYMPTOM  OR DIAGNOSIS REQUIRED)    Answer:  left knee pain    Order Specific Question:   Preferred imaging location?    Answer:   [facility]   No orders of the defined types were placed in this encounter.   Discussed warning signs or symptoms. Please see discharge instructions. Patient expresses understanding.   The  above documentation has been reviewed and is accurate and complete Lynne Leader, M.D.

## 2022-05-28 NOTE — Patient Instructions (Addendum)
Thank you for coming in today.   Please get an Xray today before you leave   You received an injection today. Seek immediate medical attention if the joint becomes red, extremely painful, or is oozing fluid.

## 2022-05-31 NOTE — Progress Notes (Signed)
Left knee x-ray shows some mild arthritis

## 2022-07-02 DIAGNOSIS — N1831 Chronic kidney disease, stage 3a: Secondary | ICD-10-CM | POA: Diagnosis not present

## 2022-08-10 DIAGNOSIS — Z0000 Encounter for general adult medical examination without abnormal findings: Secondary | ICD-10-CM | POA: Diagnosis not present

## 2022-08-10 DIAGNOSIS — E7800 Pure hypercholesterolemia, unspecified: Secondary | ICD-10-CM | POA: Diagnosis not present

## 2022-08-10 DIAGNOSIS — I10 Essential (primary) hypertension: Secondary | ICD-10-CM | POA: Diagnosis not present

## 2022-08-10 DIAGNOSIS — Z8639 Personal history of other endocrine, nutritional and metabolic disease: Secondary | ICD-10-CM | POA: Diagnosis not present

## 2022-08-14 DIAGNOSIS — M810 Age-related osteoporosis without current pathological fracture: Secondary | ICD-10-CM | POA: Diagnosis not present

## 2022-08-14 DIAGNOSIS — E210 Primary hyperparathyroidism: Secondary | ICD-10-CM | POA: Diagnosis not present

## 2022-08-14 DIAGNOSIS — Z8639 Personal history of other endocrine, nutritional and metabolic disease: Secondary | ICD-10-CM | POA: Diagnosis not present

## 2022-08-14 DIAGNOSIS — N1831 Chronic kidney disease, stage 3a: Secondary | ICD-10-CM | POA: Diagnosis not present

## 2022-08-15 DIAGNOSIS — R0789 Other chest pain: Secondary | ICD-10-CM | POA: Diagnosis not present

## 2022-08-15 DIAGNOSIS — K52839 Microscopic colitis, unspecified: Secondary | ICD-10-CM | POA: Diagnosis not present

## 2022-08-15 DIAGNOSIS — N261 Atrophy of kidney (terminal): Secondary | ICD-10-CM | POA: Diagnosis not present

## 2022-08-15 DIAGNOSIS — D649 Anemia, unspecified: Secondary | ICD-10-CM | POA: Diagnosis not present

## 2022-08-15 DIAGNOSIS — F339 Major depressive disorder, recurrent, unspecified: Secondary | ICD-10-CM | POA: Diagnosis not present

## 2022-08-15 DIAGNOSIS — I701 Atherosclerosis of renal artery: Secondary | ICD-10-CM | POA: Diagnosis not present

## 2022-08-15 DIAGNOSIS — N1832 Chronic kidney disease, stage 3b: Secondary | ICD-10-CM | POA: Diagnosis not present

## 2022-08-15 DIAGNOSIS — Z0000 Encounter for general adult medical examination without abnormal findings: Secondary | ICD-10-CM | POA: Diagnosis not present

## 2022-08-15 DIAGNOSIS — Z8639 Personal history of other endocrine, nutritional and metabolic disease: Secondary | ICD-10-CM | POA: Diagnosis not present

## 2022-08-15 DIAGNOSIS — M810 Age-related osteoporosis without current pathological fracture: Secondary | ICD-10-CM | POA: Diagnosis not present

## 2022-08-15 DIAGNOSIS — I10 Essential (primary) hypertension: Secondary | ICD-10-CM | POA: Diagnosis not present

## 2022-08-15 DIAGNOSIS — J0100 Acute maxillary sinusitis, unspecified: Secondary | ICD-10-CM | POA: Diagnosis not present

## 2022-08-16 ENCOUNTER — Other Ambulatory Visit: Payer: Self-pay | Admitting: Internal Medicine

## 2022-08-16 ENCOUNTER — Encounter: Payer: Self-pay | Admitting: Internal Medicine

## 2022-08-16 DIAGNOSIS — I10 Essential (primary) hypertension: Secondary | ICD-10-CM

## 2022-08-29 ENCOUNTER — Ambulatory Visit (HOSPITAL_COMMUNITY)
Admission: RE | Admit: 2022-08-29 | Discharge: 2022-08-29 | Disposition: A | Discharge status: Home / Self Care | Payer: Medicare Other | Source: Ambulatory Visit | Attending: Internal Medicine | Admitting: Internal Medicine

## 2022-08-29 DIAGNOSIS — I10 Essential (primary) hypertension: Secondary | ICD-10-CM

## 2022-08-31 NOTE — Progress Notes (Unsigned)
I, Peterson Lombard, LAT, ATC acting as a scribe for Lynne Leader, MD.  Martha Higgins is a 77 y.o. female who presents to [place] at [facility] today for continued left knee pain.  Patient was last seen by Dr. Georgina Snell on 05/28/2022 and was given a left knee steroid injection and advised to use Voltaren gel and Tylenol.  Today, patient reports  L knee swelling:  Dx imaging: 05/28/2022 L knee XR  Pertinent review of systems: ***  Relevant historical information: ***   Exam:  There were no vitals taken for this visit. General: Well Developed, well nourished, and in no acute distress.   MSK: ***    Lab and Radiology Results No results found for this or any previous visit (from the past 72 hour(s)). CT CARDIAC SCORING (SELF PAY ONLY)  Addendum Date: 08/29/2022   ADDENDUM REPORT: 08/29/2022 13:53 EXAM: OVER-READ INTERPRETATION  CT CHEST The following report is an over-read performed by radiologist Dr. [facility] on 08/29/2022. This over-read does not include interpretation of cardiac or coronary anatomy or pathology. The coronary calcium score interpretation by the cardiologist is attached. COMPARISON:  None. FINDINGS: Atherosclerotic calcifications aorta. Aorta normal caliber. No pericardial effusion. Esophagus unremarkable. Visualized upper abdomen normal. No thoracic adenopathy. Lungs clear. Osseous structures unremarkable. IMPRESSION: No acute abnormalities. Aortic Atherosclerosis (ICD10-I70.0). Electronically Signed   By: Lavonia Dana M.D.   On: 08/29/2022 13:53   Result Date: 08/29/2022 CLINICAL DATA:  Cardiovascular disease risk stratification EXAM: CT Coronary Calcium Score TECHNIQUE: A gated, non-contrast computed tomography scan of the heart was performed using 39m slice thickness. Axial images were analyzed on a dedicated workstation. Calcium scoring of the coronary arteries was performed using the Agatston method. FINDINGS: Coronary  Calcium Score: Left main: 0 Left anterior descending artery: 46 Left circumflex artery: 0 Right coronary artery: 0 Total: 46 Percentile: 43rd Trivial aortic valve calcifications, AV calcium score 53. Pericardium: Normal. Ascending Aorta: Normal caliber. Ascending aorta measures approximately 326mat the mid ascending aorta measured in a non-contrast axial plane. Non-cardiac: [facility] IMPRESSION: Coronary calcium score of 46. This was 43rd percentile for age-, race-, and sex-matched controls. RECOMMENDATIONS: Coronary artery calcium (CAC) score is a strong predictor of incident coronary heart disease (CHD) and provides predictive information beyond traditional risk factors. CAC scoring is reasonable to use in the decision to withhold, postpone, or initiate statin therapy in intermediate-risk or selected borderline-risk asymptomatic adults (age 78-75ears and LDL-C >=70 to <190 mg/dL) who do not have diabetes or established atherosclerotic cardiovascular disease (ASCVD).* In intermediate-risk (10-year ASCVD risk >=7.5% to <20%) adults or selected borderline-risk (10-year ASCVD risk >=5% to <7.5%) adults in whom a CAC score is measured for the purpose of making a treatment decision the following recommendations have been made: If CAC=0, it is reasonable to withhold statin therapy and reassess in 5 to 10 years, as long as higher risk conditions are absent (diabetes mellitus, family history of premature CHD in first degree relatives (males <55 years; females <65 years), cigarette smoking, or LDL >=190 mg/dL). If CAC is 1 to 99, it is reasonable to initiate statin therapy for patients >=5571ears of age. If CAC is >=100 or >=75th percentile, it is reasonable to initiate statin therapy at any age. Cardiology referral should be considered for patients with CAC scores >=400 or >=75th percentile. *2018 AHA/ACC/AACVPR/AAPA/ABC/ACPM/ADA/AGS/APhA/ASPC/NLA/PCNA Guideline on the Management of  Blood Cholesterol: A Report of the [employer]/American  Heart Association Task Force on Clinical Practice Guidelines. J Am Coll Cardiol. 2019;73(24):3168-3209. Electronically Signed: By: Cherlynn Kaiser M.D. On: 08/29/2022 10:40       Assessment and Plan: 77 y.o. female with ***   PDMP not reviewed this encounter. No orders of the defined types were placed in this encounter.  No orders of the defined types were placed in this encounter.    Discussed warning signs or symptoms. Please see discharge instructions. Patient expresses understanding.   ***

## 2022-09-03 ENCOUNTER — Ambulatory Visit: Payer: Medicare Other

## 2022-09-03 ENCOUNTER — Ambulatory Visit (INDEPENDENT_AMBULATORY_CARE_PROVIDER_SITE_OTHER): Payer: Medicare Other | Admitting: Family Medicine

## 2022-09-03 ENCOUNTER — Encounter: Payer: Self-pay | Admitting: Family Medicine

## 2022-09-03 VITALS — BP 140/92 | HR 74 | Wt 117.0 lb

## 2022-09-03 DIAGNOSIS — M25562 Pain in left knee: Secondary | ICD-10-CM | POA: Diagnosis not present

## 2022-09-03 DIAGNOSIS — G8929 Other chronic pain: Secondary | ICD-10-CM | POA: Diagnosis not present

## 2022-09-03 DIAGNOSIS — M1712 Unilateral primary osteoarthritis, left knee: Secondary | ICD-10-CM | POA: Diagnosis not present

## 2022-09-03 NOTE — Patient Instructions (Addendum)
You received an injection today. Seek immediate medical attention if the joint becomes red, extremely painful, or is oozing fluid.   

## 2022-09-05 ENCOUNTER — Ambulatory Visit: Payer: Medicare Other | Admitting: Cardiology

## 2022-09-05 ENCOUNTER — Encounter: Payer: Self-pay | Admitting: Cardiology

## 2022-09-05 VITALS — BP 157/93 | HR 78 | Ht 62.0 in | Wt 116.2 lb

## 2022-09-05 DIAGNOSIS — R931 Abnormal findings on diagnostic imaging of heart and coronary circulation: Secondary | ICD-10-CM | POA: Diagnosis not present

## 2022-09-05 DIAGNOSIS — L600 Ingrowing nail: Secondary | ICD-10-CM | POA: Diagnosis not present

## 2022-09-05 DIAGNOSIS — B351 Tinea unguium: Secondary | ICD-10-CM | POA: Diagnosis not present

## 2022-09-05 DIAGNOSIS — R072 Precordial pain: Secondary | ICD-10-CM | POA: Insufficient documentation

## 2022-09-05 MED ORDER — NITROGLYCERIN 0.4 MG SL SUBL: 0.4000 mg | SUBLINGUAL_TABLET | SUBLINGUAL | 3 refills | Status: AC | PRN
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
## 2022-09-19 DIAGNOSIS — E559 Vitamin D deficiency, unspecified: Secondary | ICD-10-CM | POA: Diagnosis not present

## 2022-09-19 DIAGNOSIS — I10 Essential (primary) hypertension: Secondary | ICD-10-CM | POA: Diagnosis not present

## 2022-09-24 DIAGNOSIS — I2510 Atherosclerotic heart disease of native coronary artery without angina pectoris: Secondary | ICD-10-CM | POA: Diagnosis not present

## 2022-09-24 DIAGNOSIS — I700 Atherosclerosis of aorta: Secondary | ICD-10-CM | POA: Diagnosis not present

## 2022-12-13 NOTE — Telephone Encounter (Signed)
Patient would like to go ahead with the gel injection. Can you make sure that the Berkley Harvey is still good?

## 2022-12-14 ENCOUNTER — Telehealth: Payer: Self-pay

## 2022-12-14 NOTE — Telephone Encounter (Signed)
Orthovisc for LEFT knee OA  Primary Insurance: Medicare Co-Pay: n/a Co-insurance: 20% Deductible: $240 of $240 met Prior Auth NOT required  Secondary Insurance: BCBS [place] Medicare Adv Co-Pay: $0 Co-Insurance: covers 20% Medicare co-insurance Deductible: covers Medicare deductible of which $240 of $240 has been met Prior Auth NOT required

## 2022-12-14 NOTE — Telephone Encounter (Signed)
VOB initiated for Orthovisc for LEFT knee OA.  

## 2022-12-18 NOTE — Telephone Encounter (Signed)
scheduled

## 2022-12-21 ENCOUNTER — Ambulatory Visit (INDEPENDENT_AMBULATORY_CARE_PROVIDER_SITE_OTHER): Payer: Medicare Other | Admitting: Family Medicine

## 2022-12-21 ENCOUNTER — Other Ambulatory Visit: Payer: Self-pay

## 2022-12-21 DIAGNOSIS — M1712 Unilateral primary osteoarthritis, left knee: Secondary | ICD-10-CM | POA: Diagnosis not present

## 2022-12-21 DIAGNOSIS — G8929 Other chronic pain: Secondary | ICD-10-CM

## 2022-12-21 DIAGNOSIS — M25562 Pain in left knee: Secondary | ICD-10-CM | POA: Diagnosis not present

## 2022-12-21 MED ORDER — HYALURONAN 30 MG/2ML IX SOSY
30.0000 mg | PREFILLED_SYRINGE | Freq: Once | INTRA_ARTICULAR | Status: AC
  Administered 2022-12-21: 30 mg via INTRA_ARTICULAR

## 2022-12-21 NOTE — Progress Notes (Signed)
Martha Higgins presents to clinic today for Orthovisc injection left knee 1/3  Procedure: Real-time Ultrasound Guided Injection of left knee joint superior lateral patellar space Device: Philips Affiniti 50G Images permanently stored and available for review in PACS Verbal informed consent obtained.  Discussed risks and benefits of procedure. Warned about infection, bleeding, damage to structures among others. Patient expresses understanding and agreement Time-out conducted.   Noted no overlying erythema, induration, or other signs of local infection.   Skin prepped in a sterile fashion.   Local anesthesia: Topical Ethyl chloride.   With sterile technique and under real time ultrasound guidance: Orthovisc 30 mg injected into knee joint. Fluid seen entering the joint capsule.   Completed without difficulty   Advised to call if fevers/chills, erythema, induration, drainage, or persistent bleeding.   Images permanently stored and available for review in the ultrasound unit.  Impression: Technically successful ultrasound guided injection.  Lot number: [id number]  Return in 1 week for Orthovisc injection left knee 2/3

## 2022-12-21 NOTE — Patient Instructions (Signed)
Thank you for coming in today.   You received an injection today. Seek immediate medical attention if the joint becomes red, extremely painful, or is oozing fluid.   Schedule the 2nd Orthovisc for next week and the 3rd for the week after that.  Have a great holiday weekend!!

## 2022-12-21 NOTE — Telephone Encounter (Addendum)
Orthovisc injection series for LEFT knee OA  #1 scheduled 12/21/22 #2 scheduled 12/28/22 #3 scheduled 01/04/23

## 2022-12-22 ENCOUNTER — Encounter: Payer: Self-pay | Admitting: Family Medicine

## 2022-12-28 ENCOUNTER — Other Ambulatory Visit: Payer: Self-pay

## 2022-12-28 ENCOUNTER — Ambulatory Visit (INDEPENDENT_AMBULATORY_CARE_PROVIDER_SITE_OTHER): Payer: Medicare Other | Admitting: Family Medicine

## 2022-12-28 DIAGNOSIS — G8929 Other chronic pain: Secondary | ICD-10-CM

## 2022-12-28 DIAGNOSIS — M25562 Pain in left knee: Secondary | ICD-10-CM

## 2022-12-28 DIAGNOSIS — M1712 Unilateral primary osteoarthritis, left knee: Secondary | ICD-10-CM | POA: Diagnosis not present

## 2022-12-28 MED ORDER — HYALURONAN 30 MG/2ML IX SOSY
30.0000 mg | PREFILLED_SYRINGE | Freq: Once | INTRA_ARTICULAR | Status: AC
  Administered 2022-12-28: 30 mg via INTRA_ARTICULAR

## 2022-12-28 NOTE — Patient Instructions (Signed)
Thank you for coming in today.   You received an injection today. Seek immediate medical attention if the joint becomes red, extremely painful, or is oozing fluid.   We will see you next week for the 3rd Orthovisc injection. 

## 2022-12-28 NOTE — Progress Notes (Signed)
Martha Higgins presents to clinic today for Orthovisc injection left knee 2/3 Procedure: Real-time Ultrasound Guided Injection of left knee joint superior lateral patellar space Device: Philips Affiniti 50G Images permanently stored and available for review in PACS Verbal informed consent obtained.  Discussed risks and benefits of procedure. Warned about infection, bleeding, damage to structures among others. Patient expresses understanding and agreement Time-out conducted.   Noted no overlying erythema, induration, or other signs of local infection.   Skin prepped in a sterile fashion.   Local anesthesia: Topical Ethyl chloride.   With sterile technique and under real time ultrasound guidance: Orthovisc 30 mg injected into knee joint. Fluid seen entering the joint capsule.   Completed without difficulty   Advised to call if fevers/chills, erythema, induration, drainage, or persistent bleeding.   Images permanently stored and available for review in the ultrasound unit.  Impression: Technically successful ultrasound guided injection. Lot number: [id number] Return in 1 week for Orthovisc injection left knee 2/3

## 2023-01-04 ENCOUNTER — Ambulatory Visit (INDEPENDENT_AMBULATORY_CARE_PROVIDER_SITE_OTHER): Payer: Medicare Other | Admitting: Family Medicine

## 2023-01-04 ENCOUNTER — Other Ambulatory Visit: Payer: Self-pay

## 2023-01-04 DIAGNOSIS — G8929 Other chronic pain: Secondary | ICD-10-CM | POA: Diagnosis not present

## 2023-01-04 DIAGNOSIS — M25562 Pain in left knee: Secondary | ICD-10-CM | POA: Diagnosis not present

## 2023-01-04 DIAGNOSIS — M1712 Unilateral primary osteoarthritis, left knee: Secondary | ICD-10-CM

## 2023-01-04 MED ORDER — HYALURONAN 30 MG/2ML IX SOSY
30.0000 mg | PREFILLED_SYRINGE | Freq: Once | INTRA_ARTICULAR | Status: AC
  Administered 2023-01-04: 30 mg via INTRA_ARTICULAR

## 2023-01-04 NOTE — Progress Notes (Signed)
Martha Higgins presents to clinic today for Orthovisc injection left knee 3/3 Procedure: Real-time Ultrasound Guided Injection of knee joint superior lateral patellar space Device: Philips Affiniti 50G Images permanently stored and available for review in PACS Verbal informed consent obtained.  Discussed risks and benefits of procedure. Warned about infection, bleeding, damage to structures among others. Patient expresses understanding and agreement Time-out conducted.   Noted no overlying erythema, induration, or other signs of local infection.   Skin prepped in a sterile fashion.   Local anesthesia: Topical Ethyl chloride.   With sterile technique and under real time ultrasound guidance: Orthovisc 30 mg injected into knee joint. Fluid seen entering the joint capsule.   Completed without difficulty   Advised to call if fevers/chills, erythema, induration, drainage, or persistent bleeding.   Images permanently stored and available for review in the ultrasound unit.  Impression: Technically successful ultrasound guided injection. Lot number: [id number]

## 2023-01-04 NOTE — Patient Instructions (Addendum)
Thank you for coming in today.   You received an injection today. Seek immediate medical attention if the joint becomes red, extremely painful, or is oozing fluid.  

## 2023-01-22 DIAGNOSIS — N1832 Chronic kidney disease, stage 3b: Secondary | ICD-10-CM | POA: Diagnosis not present

## 2023-01-29 DIAGNOSIS — E875 Hyperkalemia: Secondary | ICD-10-CM | POA: Diagnosis not present

## 2023-01-29 DIAGNOSIS — E871 Hypo-osmolality and hyponatremia: Secondary | ICD-10-CM | POA: Diagnosis not present

## 2023-01-29 DIAGNOSIS — N261 Atrophy of kidney (terminal): Secondary | ICD-10-CM | POA: Diagnosis not present

## 2023-01-29 DIAGNOSIS — I129 Hypertensive chronic kidney disease with stage 1 through stage 4 chronic kidney disease, or unspecified chronic kidney disease: Secondary | ICD-10-CM | POA: Diagnosis not present

## 2023-01-29 DIAGNOSIS — N1831 Chronic kidney disease, stage 3a: Secondary | ICD-10-CM | POA: Diagnosis not present

## 2023-01-30 DIAGNOSIS — L600 Ingrowing nail: Secondary | ICD-10-CM | POA: Diagnosis not present

## 2023-01-30 DIAGNOSIS — F339 Major depressive disorder, recurrent, unspecified: Secondary | ICD-10-CM | POA: Diagnosis not present

## 2023-01-30 DIAGNOSIS — B351 Tinea unguium: Secondary | ICD-10-CM | POA: Diagnosis not present

## 2023-01-30 DIAGNOSIS — F5104 Psychophysiologic insomnia: Secondary | ICD-10-CM | POA: Diagnosis not present

## 2023-02-13 DIAGNOSIS — L600 Ingrowing nail: Secondary | ICD-10-CM | POA: Diagnosis not present

## 2023-02-13 DIAGNOSIS — M792 Neuralgia and neuritis, unspecified: Secondary | ICD-10-CM | POA: Diagnosis not present

## 2023-03-13 DIAGNOSIS — F339 Major depressive disorder, recurrent, unspecified: Secondary | ICD-10-CM | POA: Diagnosis not present

## 2023-03-13 DIAGNOSIS — F5104 Psychophysiologic insomnia: Secondary | ICD-10-CM | POA: Diagnosis not present

## 2023-03-15 DIAGNOSIS — M792 Neuralgia and neuritis, unspecified: Secondary | ICD-10-CM | POA: Diagnosis not present

## 2023-03-15 DIAGNOSIS — L600 Ingrowing nail: Secondary | ICD-10-CM | POA: Diagnosis not present

## 2023-03-15 DIAGNOSIS — B351 Tinea unguium: Secondary | ICD-10-CM | POA: Diagnosis not present

## 2023-05-15 DIAGNOSIS — L600 Ingrowing nail: Secondary | ICD-10-CM | POA: Diagnosis not present

## 2023-05-31 NOTE — Telephone Encounter (Signed)
VOB initiated for Orthovisc for LEFT knee OA.  

## 2023-06-03 NOTE — Telephone Encounter (Signed)
Holding until needed by patient.

## 2023-06-03 NOTE — Telephone Encounter (Signed)
Prior Authorization is NOT required for Orthovisc for LEFT knee OA  NOT covered under pharmacy benefits

## 2023-06-03 NOTE — Telephone Encounter (Signed)
ORTHOVISC for LEFT knee OA OK to schedule on or after 07/07/23  Primary Insurance: Medicare Co-Pay: n/a Co-Insurance: 20% Deductible: $240 of $240 met  Prior Authorization: NOT required  Secondary Insurance: BCBS Medicare Supplement Co-Pay: n/a Co-Insurance: Covers Medicare Part B co-insurance Deductible:  Covered by secondary  Prior Authorization: NOT required    Knee Injection History: 05/28/22 - Kenalog LEFT 09/03/22 - Kenalog LEFT 12/21/22 - Orthovisc #1 LEFT 12/28/22 - Orthovisc #2 LEFT 01/04/23 - Orthovisc #3 LEFT

## 2023-07-09 ENCOUNTER — Ambulatory Visit (INDEPENDENT_AMBULATORY_CARE_PROVIDER_SITE_OTHER): Payer: Medicare Other | Admitting: Family Medicine

## 2023-07-09 ENCOUNTER — Other Ambulatory Visit: Payer: Self-pay

## 2023-07-09 VITALS — BP 112/70 | HR 62 | Ht 62.0 in | Wt 116.0 lb

## 2023-07-09 DIAGNOSIS — M1712 Unilateral primary osteoarthritis, left knee: Secondary | ICD-10-CM

## 2023-07-09 DIAGNOSIS — N1831 Chronic kidney disease, stage 3a: Secondary | ICD-10-CM

## 2023-07-09 DIAGNOSIS — M25562 Pain in left knee: Secondary | ICD-10-CM

## 2023-07-09 DIAGNOSIS — G8929 Other chronic pain: Secondary | ICD-10-CM

## 2023-07-09 MED ORDER — HYALURONAN 30 MG/2ML IX SOSY
30.0000 mg | PREFILLED_SYRINGE | Freq: Once | INTRA_ARTICULAR | Status: AC
  Administered 2023-07-09: 30 mg via INTRA_ARTICULAR

## 2023-07-09 NOTE — Patient Instructions (Addendum)
Thank you for coming in today.   You received an injection today. Seek immediate medical attention if the joint becomes red, extremely painful, or is oozing fluid.   Schedule the 2nd Orthovisc injection for next week and the 3rd for the week after Christmas

## 2023-07-09 NOTE — Progress Notes (Signed)
   Martha Payor, PhD, LAT, ATC acting as a scribe for Martha Graham, MD.  Martha Higgins is a 77 y.o. female who presents to [facility] today for cont'd L knee pain. Pt was last seen by Dr. Denyse Amass on 01/04/23 and completed the Orthovisc series, 3/3, in her L knee.   Today, pt reports L knee pain isn't too bad today, but has been painful. Pain returning mid-Oct. She is currently watching her 2-y/o great grandson. She expressed some confusion about the gel shots re-generating cartilage growth.  Dx imaging: 05/28/2022 L knee XR   Pertinent review of systems: No fevers or chills  Relevant historical information: Hypertension.  CKD 3.  History of a subdural hematoma   Exam:  BP 112/70   Pulse 62   Ht 5\' 2"  (1.575 m)   Wt 116 lb (52.6 kg)   SpO2 98%   BMI 21.22 kg/m  General: Well Developed, well nourished, and in no acute distress.   MSK: Left knee mild effusion normal motion with crepitation.  Normal gait.    Lab and Radiology Results  TASHE WANDER presents to clinic today for Orthovisc injection left knee 1/3 Procedure: Real-time Ultrasound Guided Injection of left knee joint superior lateral patellar space Device: Philips Affiniti 50G/GE Logiq Images permanently stored and available for review in PACS Verbal informed consent obtained.  Discussed risks and benefits of procedure. Warned about infection, bleeding, damage to structures among others. Patient expresses understanding and agreement Time-out conducted.   Noted no overlying erythema, induration, or other signs of local infection.   Skin prepped in a sterile fashion.   Local anesthesia: Topical Ethyl chloride.   With sterile technique and under real time ultrasound guidance: Orthovisc 30 mg injected into knee joint. Fluid seen entering the joint capsule.   Completed without difficulty   Advised to call if fevers/chills, erythema, induration, drainage, or persistent bleeding.    Images permanently stored and available for review in the ultrasound unit.  Impression: Technically successful ultrasound guided injection. Lot number: [id number]     Assessment and Plan: 77 y.o. female with left knee pain due to exacerbation of DJD.  Plan for Orthovisc injection series starting today.  Injection #2 will be in 1 week.  Injection 3 normally done at 3 weeks will not be feasible as that is Christmas week.  She should be able to get in Monday or Tuesday December 30 or 31 for Orthovisc injection #3.  We talked about current pain management.  Okay to use topical Voltaren gel but should avoid oral NSAIDs given CKD 3.   PDMP not reviewed this encounter. Orders Placed This Encounter  Procedures   [place] LIMITED JOINT SPACE STRUCTURES LOW LEFT(NO LINKED CHARGES)    Order Specific Question:   Reason for Exam (SYMPTOM  OR DIAGNOSIS REQUIRED)    Answer:   left knee pain    Order Specific Question:   Preferred imaging location?    Answer:   Adult nurse [facility] ordered this encounter  Medications   Hyaluronan (ORTHOVISC) intra-articular injection 30 mg     Discussed warning signs or symptoms. Please see discharge instructions. Patient expresses understanding.   The above documentation has been reviewed and is accurate and complete Martha Higgins, M.D.

## 2023-07-16 ENCOUNTER — Ambulatory Visit (INDEPENDENT_AMBULATORY_CARE_PROVIDER_SITE_OTHER): Payer: Medicare Other | Admitting: Family Medicine

## 2023-07-16 ENCOUNTER — Other Ambulatory Visit: Payer: Self-pay

## 2023-07-16 DIAGNOSIS — M1712 Unilateral primary osteoarthritis, left knee: Secondary | ICD-10-CM | POA: Diagnosis not present

## 2023-07-16 MED ORDER — HYALURONAN 30 MG/2ML IX SOSY
30.0000 mg | PREFILLED_SYRINGE | Freq: Once | INTRA_ARTICULAR | Status: AC
  Administered 2023-07-16: 30 mg via INTRA_ARTICULAR

## 2023-07-16 NOTE — Progress Notes (Signed)
Martha Higgins presents to clinic today for Orthovisc injection left knee 2/3 Procedure: Real-time Ultrasound Guided Injection of left knee joint superior lateral patellar space Device: Philips Affiniti 50G/GE Logiq Images permanently stored and available for review in PACS Verbal informed consent obtained.  Discussed risks and benefits of procedure. Warned about infection, bleeding, damage to structures among others. Patient expresses understanding and agreement Time-out conducted.   Noted no overlying erythema, induration, or other signs of local infection.   Skin prepped in a sterile fashion.   Local anesthesia: Topical Ethyl chloride.   With sterile technique and under real time ultrasound guidance: Orthovisc 30 mg injected into knee joint. Fluid seen entering the joint capsule.   Completed without difficulty   Advised to call if fevers/chills, erythema, induration, drainage, or persistent bleeding.   Images permanently stored and available for review in the ultrasound unit.  Impression: Technically successful ultrasound guided injection. Lot number: [id number] Return as scheduled on December 30 for injection #3

## 2023-07-16 NOTE — Patient Instructions (Addendum)
Thank you for coming in today.   You received an injection today. Seek immediate medical attention if the joint becomes red, extremely painful, or is oozing fluid.    See you Monday the 30th at 230pm

## 2023-07-29 ENCOUNTER — Ambulatory Visit: Payer: Medicare Other | Admitting: Family Medicine

## 2023-07-29 DIAGNOSIS — M1712 Unilateral primary osteoarthritis, left knee: Secondary | ICD-10-CM

## 2023-07-30 ENCOUNTER — Ambulatory Visit (INDEPENDENT_AMBULATORY_CARE_PROVIDER_SITE_OTHER): Payer: Medicare Other | Admitting: Family Medicine

## 2023-07-30 ENCOUNTER — Other Ambulatory Visit: Payer: Self-pay

## 2023-07-30 DIAGNOSIS — M1712 Unilateral primary osteoarthritis, left knee: Secondary | ICD-10-CM

## 2023-07-30 MED ORDER — HYALURONAN 30 MG/2ML IX SOSY
30.0000 mg | PREFILLED_SYRINGE | Freq: Once | INTRA_ARTICULAR | Status: AC
  Administered 2023-07-30: 30 mg via INTRA_ARTICULAR

## 2023-07-30 NOTE — Progress Notes (Signed)
 Martha Higgins presents to clinic today for Orthovisc injection left knee 3/3 Procedure: Real-time Ultrasound Guided Injection of left knee joint superior lateral patellar space Device: Philips Affiniti 50G/GE Logiq Images permanently stored and available for review in PACS Verbal informed consent obtained.  Discussed risks and benefits of procedure. Warned about infection, bleeding, damage to structures among others. Patient expresses understanding and agreement Time-out conducted.   Noted no overlying erythema, induration, or other signs of local infection.   Skin prepped in a sterile fashion.   Local anesthesia: Topical Ethyl chloride.   With sterile technique and under real time ultrasound guidance: Orthovisc 30 mg injected into knee joint. Fluid seen entering the joint capsule.   Completed without difficulty   Advised to call if fevers/chills, erythema, induration, drainage, or persistent bleeding.   Images permanently stored and available for review in the ultrasound unit.  Impression: Technically successful ultrasound guided injection. Lot number: [id number]

## 2023-07-30 NOTE — Patient Instructions (Signed)
 Thank you for coming in today.   You received an injection today. Seek immediate medical attention if the joint becomes red, extremely painful, or is oozing fluid.   Have a GREAT New Year!!

## 2023-08-09 NOTE — Telephone Encounter (Signed)
 Orthovisc injection series: 07/09/23 - Orthovisc #1 LEFT 07/16/23 - Orthovisc #2 LEFT 07/30/23 - Orthovisc #3 LEFT  Can consider repeat injections on or after 01/28/24

## 2023-08-14 DIAGNOSIS — L600 Ingrowing nail: Secondary | ICD-10-CM | POA: Diagnosis not present

## 2023-08-14 DIAGNOSIS — Q828 Other specified congenital malformations of skin: Secondary | ICD-10-CM | POA: Diagnosis not present

## 2023-08-14 DIAGNOSIS — B070 Plantar wart: Secondary | ICD-10-CM | POA: Diagnosis not present

## 2023-08-22 DIAGNOSIS — W540XXA Bitten by dog, initial encounter: Secondary | ICD-10-CM | POA: Diagnosis not present

## 2023-08-22 DIAGNOSIS — S61051A Open bite of right thumb without damage to nail, initial encounter: Secondary | ICD-10-CM | POA: Diagnosis not present

## 2023-08-22 DIAGNOSIS — S51852A Open bite of left forearm, initial encounter: Secondary | ICD-10-CM | POA: Diagnosis not present

## 2023-08-22 DIAGNOSIS — S62524B Nondisplaced fracture of distal phalanx of right thumb, initial encounter for open fracture: Secondary | ICD-10-CM | POA: Diagnosis not present

## 2023-08-23 DIAGNOSIS — Z23 Encounter for immunization: Secondary | ICD-10-CM | POA: Diagnosis not present

## 2023-08-26 DIAGNOSIS — S51852A Open bite of left forearm, initial encounter: Secondary | ICD-10-CM | POA: Diagnosis not present

## 2023-08-26 DIAGNOSIS — S62524B Nondisplaced fracture of distal phalanx of right thumb, initial encounter for open fracture: Secondary | ICD-10-CM | POA: Diagnosis not present

## 2023-09-04 DIAGNOSIS — S62524B Nondisplaced fracture of distal phalanx of right thumb, initial encounter for open fracture: Secondary | ICD-10-CM | POA: Diagnosis not present

## 2023-09-11 DIAGNOSIS — M810 Age-related osteoporosis without current pathological fracture: Secondary | ICD-10-CM | POA: Diagnosis not present

## 2023-09-11 DIAGNOSIS — E7800 Pure hypercholesterolemia, unspecified: Secondary | ICD-10-CM | POA: Diagnosis not present

## 2023-09-11 DIAGNOSIS — I10 Essential (primary) hypertension: Secondary | ICD-10-CM | POA: Diagnosis not present

## 2023-09-11 LAB — LAB REPORT - SCANNED: EGFR: 47

## 2023-09-17 DIAGNOSIS — I119 Hypertensive heart disease without heart failure: Secondary | ICD-10-CM | POA: Diagnosis not present

## 2023-09-17 DIAGNOSIS — F339 Major depressive disorder, recurrent, unspecified: Secondary | ICD-10-CM | POA: Diagnosis not present

## 2023-09-17 DIAGNOSIS — F5104 Psychophysiologic insomnia: Secondary | ICD-10-CM | POA: Diagnosis not present

## 2023-09-17 DIAGNOSIS — Z23 Encounter for immunization: Secondary | ICD-10-CM | POA: Diagnosis not present

## 2023-09-17 DIAGNOSIS — N1831 Chronic kidney disease, stage 3a: Secondary | ICD-10-CM | POA: Diagnosis not present

## 2023-09-17 DIAGNOSIS — N261 Atrophy of kidney (terminal): Secondary | ICD-10-CM | POA: Diagnosis not present

## 2023-09-17 DIAGNOSIS — K52839 Microscopic colitis, unspecified: Secondary | ICD-10-CM | POA: Diagnosis not present

## 2023-09-17 DIAGNOSIS — I10 Essential (primary) hypertension: Secondary | ICD-10-CM | POA: Diagnosis not present

## 2023-09-17 DIAGNOSIS — E559 Vitamin D deficiency, unspecified: Secondary | ICD-10-CM | POA: Diagnosis not present

## 2023-09-17 DIAGNOSIS — Z0000 Encounter for general adult medical examination without abnormal findings: Secondary | ICD-10-CM | POA: Diagnosis not present

## 2023-09-17 DIAGNOSIS — I2510 Atherosclerotic heart disease of native coronary artery without angina pectoris: Secondary | ICD-10-CM | POA: Diagnosis not present

## 2023-09-17 DIAGNOSIS — I700 Atherosclerosis of aorta: Secondary | ICD-10-CM | POA: Diagnosis not present

## 2023-09-18 DIAGNOSIS — W540XXA Bitten by dog, initial encounter: Secondary | ICD-10-CM | POA: Diagnosis not present

## 2023-09-18 DIAGNOSIS — S51852A Open bite of left forearm, initial encounter: Secondary | ICD-10-CM | POA: Diagnosis not present

## 2023-09-18 DIAGNOSIS — S62524B Nondisplaced fracture of distal phalanx of right thumb, initial encounter for open fracture: Secondary | ICD-10-CM | POA: Diagnosis not present

## 2023-10-09 DIAGNOSIS — S62524B Nondisplaced fracture of distal phalanx of right thumb, initial encounter for open fracture: Secondary | ICD-10-CM | POA: Diagnosis not present

## 2023-10-09 DIAGNOSIS — W540XXA Bitten by dog, initial encounter: Secondary | ICD-10-CM | POA: Diagnosis not present

## 2023-11-20 DIAGNOSIS — M792 Neuralgia and neuritis, unspecified: Secondary | ICD-10-CM | POA: Diagnosis not present

## 2023-11-20 DIAGNOSIS — L600 Ingrowing nail: Secondary | ICD-10-CM | POA: Diagnosis not present

## 2023-11-20 DIAGNOSIS — B351 Tinea unguium: Secondary | ICD-10-CM | POA: Diagnosis not present

## 2023-11-20 DIAGNOSIS — Q828 Other specified congenital malformations of skin: Secondary | ICD-10-CM | POA: Diagnosis not present

## 2024-01-13 NOTE — Telephone Encounter (Signed)
 Patient has not been seen this year. Will run patient only once they have an appointment with our office and doctor approves it to be ran at that visit

## 2024-01-22 DIAGNOSIS — N1832 Chronic kidney disease, stage 3b: Secondary | ICD-10-CM | POA: Diagnosis not present

## 2024-01-28 DIAGNOSIS — N261 Atrophy of kidney (terminal): Secondary | ICD-10-CM | POA: Diagnosis not present

## 2024-01-28 DIAGNOSIS — E875 Hyperkalemia: Secondary | ICD-10-CM | POA: Diagnosis not present

## 2024-01-28 DIAGNOSIS — N2581 Secondary hyperparathyroidism of renal origin: Secondary | ICD-10-CM | POA: Diagnosis not present

## 2024-01-28 DIAGNOSIS — E871 Hypo-osmolality and hyponatremia: Secondary | ICD-10-CM | POA: Diagnosis not present

## 2024-01-28 DIAGNOSIS — N1831 Chronic kidney disease, stage 3a: Secondary | ICD-10-CM | POA: Diagnosis not present

## 2024-01-28 DIAGNOSIS — I129 Hypertensive chronic kidney disease with stage 1 through stage 4 chronic kidney disease, or unspecified chronic kidney disease: Secondary | ICD-10-CM | POA: Diagnosis not present

## 2024-04-01 DIAGNOSIS — S46819A Strain of other muscles, fascia and tendons at shoulder and upper arm level, unspecified arm, initial encounter: Secondary | ICD-10-CM | POA: Diagnosis not present

## 2024-06-01 DIAGNOSIS — R0981 Nasal congestion: Secondary | ICD-10-CM | POA: Diagnosis not present

## 2024-06-01 DIAGNOSIS — J3489 Other specified disorders of nose and nasal sinuses: Secondary | ICD-10-CM | POA: Diagnosis not present
